# Patient Record
Sex: Male | Born: 1960 | Race: White | Hispanic: No | Marital: Married | Smoking: Never smoker
Health system: Southern US, Community
[De-identification: ages and names within clinical notes are randomized; demographics above are authoritative.]

## PROBLEM LIST (undated history)

## (undated) DIAGNOSIS — E785 Hyperlipidemia, unspecified: Secondary | ICD-10-CM

## (undated) HISTORY — PX: APPENDECTOMY (OPEN): SHX54

## (undated) HISTORY — PX: APPENDECTOMY: SHX54

## (undated) HISTORY — DX: Hyperlipidemia, unspecified: E78.5

## (undated) HISTORY — PX: KNEE ARTHROSCOPY: SHX127

---

## 2009-10-29 ENCOUNTER — Emergency Department (HOSPITAL_COMMUNITY): Admission: EM | Admit: 2009-10-29 | Discharge: 2009-10-29 | Payer: Self-pay | Admitting: Emergency Medicine

## 2010-12-19 ENCOUNTER — Ambulatory Visit (HOSPITAL_COMMUNITY)
Admission: RE | Admit: 2010-12-19 | Discharge: 2010-12-19 | Disposition: A | Discharge status: Home / Self Care | Payer: Self-pay | Source: Ambulatory Visit | Attending: Family Medicine | Admitting: Family Medicine

## 2010-12-19 ENCOUNTER — Other Ambulatory Visit (HOSPITAL_COMMUNITY): Payer: Self-pay | Admitting: Family Medicine

## 2010-12-19 DIAGNOSIS — R05 Cough: Secondary | ICD-10-CM | POA: Insufficient documentation

## 2010-12-19 DIAGNOSIS — R0602 Shortness of breath: Secondary | ICD-10-CM

## 2010-12-19 DIAGNOSIS — I959 Hypotension, unspecified: Secondary | ICD-10-CM | POA: Insufficient documentation

## 2010-12-19 DIAGNOSIS — R059 Cough, unspecified: Secondary | ICD-10-CM | POA: Insufficient documentation

## 2014-03-25 HISTORY — PX: ANKLE FRACTURE SURGERY: SHX122

## 2014-04-07 ENCOUNTER — Inpatient Hospital Stay: Payer: Self-pay | Admitting: Orthopaedic Surgery

## 2014-04-07 ENCOUNTER — Inpatient Hospital Stay
Admission: EM | Admit: 2014-04-07 | Discharge: 2014-04-10 | DRG: 494 | Disposition: A | Discharge status: Home / Self Care | Payer: Self-pay | Attending: Orthopaedic Surgery | Admitting: Orthopaedic Surgery

## 2014-04-07 ENCOUNTER — Emergency Department: Payer: Self-pay

## 2014-04-07 DIAGNOSIS — S82409A Unspecified fracture of shaft of unspecified fibula, initial encounter for closed fracture: Secondary | ICD-10-CM

## 2014-04-07 DIAGNOSIS — Z880 Allergy status to penicillin: Secondary | ICD-10-CM

## 2014-04-07 DIAGNOSIS — Y92838 Other recreation area as the place of occurrence of the external cause: Secondary | ICD-10-CM

## 2014-04-07 DIAGNOSIS — Y998 Other external cause status: Secondary | ICD-10-CM

## 2014-04-07 DIAGNOSIS — Y9389 Activity, other specified: Secondary | ICD-10-CM

## 2014-04-07 DIAGNOSIS — S82202A Unspecified fracture of shaft of left tibia, initial encounter for closed fracture: Secondary | ICD-10-CM

## 2014-04-07 DIAGNOSIS — M129 Arthropathy, unspecified: Secondary | ICD-10-CM | POA: Diagnosis present

## 2014-04-07 DIAGNOSIS — S82302A Unspecified fracture of lower end of left tibia, initial encounter for closed fracture: Secondary | ICD-10-CM | POA: Diagnosis present

## 2014-04-07 DIAGNOSIS — S82899A Other fracture of unspecified lower leg, initial encounter for closed fracture: Secondary | ICD-10-CM | POA: Diagnosis present

## 2014-04-07 DIAGNOSIS — H9190 Unspecified hearing loss, unspecified ear: Secondary | ICD-10-CM | POA: Diagnosis present

## 2014-04-07 DIAGNOSIS — Z9089 Acquired absence of other organs: Secondary | ICD-10-CM

## 2014-04-07 DIAGNOSIS — Y9239 Other specified sports and athletic area as the place of occurrence of the external cause: Secondary | ICD-10-CM

## 2014-04-07 DIAGNOSIS — S82402A Unspecified fracture of shaft of left fibula, initial encounter for closed fracture: Secondary | ICD-10-CM

## 2014-04-07 DIAGNOSIS — S82209A Unspecified fracture of shaft of unspecified tibia, initial encounter for closed fracture: Principal | ICD-10-CM | POA: Diagnosis present

## 2014-04-07 LAB — CBC AND DIFFERENTIAL
Basophils %: 0.5 % (ref 0.0–3.0)
Basophils Absolute: 0.1 10*3/uL (ref 0.0–0.3)
Eosinophils %: 0.6 % (ref 0.0–7.0)
Eosinophils Absolute: 0.1 10*3/uL (ref 0.0–0.8)
Hematocrit: 42.2 % (ref 39.0–52.5)
Hemoglobin: 14.7 gm/dL (ref 13.0–17.5)
Lymphocytes Absolute: 1.3 10*3/uL (ref 0.6–5.1)
Lymphocytes: 9.6 % — ABNORMAL LOW (ref 15.0–46.0)
MCH: 33 pg (ref 28–35)
MCHC: 35 gm/dL (ref 32–36)
MCV: 95 fL (ref 80–100)
MPV: 7.3 fL (ref 6.0–10.0)
Monocytes Absolute: 1 10*3/uL (ref 0.1–1.7)
Monocytes: 6.9 % (ref 3.0–15.0)
Neutrophils %: 82.4 % — ABNORMAL HIGH (ref 42.0–78.0)
Neutrophils Absolute: 11.4 10*3/uL — ABNORMAL HIGH (ref 1.7–8.6)
PLT CT: 273 10*3/uL (ref 130–440)
RBC: 4.45 10*6/uL (ref 4.00–5.70)
RDW: 12.7 % (ref 11.0–14.0)
WBC: 13.9 10*3/uL — ABNORMAL HIGH (ref 4.0–11.0)

## 2014-04-07 LAB — ECG 12-LEAD
P Wave Axis: 70 deg
P Wave Duration: 102 ms
P-R Interval: 146 ms
Patient Age: 52 years
Q-T Dispersion: 48 ms
Q-T Interval(Corrected): 383 ms
Q-T Interval: 366 ms
QRS Axis: 79 deg
QRS Duration: 84 ms
T Axis: 29 deg
Ventricular Rate: 66 /min

## 2014-04-07 LAB — BASIC METABOLIC PANEL
Anion Gap: 15.8 mMol/L (ref 7.0–18.0)
BUN / Creatinine Ratio: 21.1 Ratio (ref 10.0–30.0)
BUN: 20 mg/dL (ref 7–22)
CO2: 22.2 mMol/L (ref 20.0–30.0)
Calcium: 9.7 mg/dL (ref 8.5–10.5)
Chloride: 106 mMol/L (ref 98–110)
Creatinine: 0.95 mg/dL (ref 0.80–1.30)
EGFR: >60 mL/min/{1.73_m2}
Glucose: 126 mg/dL — ABNORMAL HIGH (ref 70–99)
Osmolality Calc: 284 mOsm/kg (ref 275–300)
Potassium: 4 mMol/L (ref 3.5–5.3)
Sodium: 140 mMol/L (ref 136–147)

## 2014-04-07 MED ORDER — VH HYDROMORPHONE HCL PF 1 MG/ML CARPUJECT
0.5000 mg | INTRAMUSCULAR | Status: DC | PRN
Start: 2014-04-07 — End: 2014-04-08
  Administered 2014-04-08 (×2): 0.5 mg via INTRAVENOUS
  Filled 2014-04-07 (×2): qty 1

## 2014-04-07 MED ORDER — ONDANSETRON 4 MG PO TBDP
ORAL_TABLET | ORAL | Status: AC
Start: 2014-04-07 — End: ?
  Filled 2014-04-07: qty 1

## 2014-04-07 MED ORDER — VH HYDROMORPHONE HCL 1 MG/ML (NARRATOR)
INTRAMUSCULAR | Status: AC
Start: 2014-04-07 — End: ?
  Filled 2014-04-07: qty 1

## 2014-04-07 MED ORDER — VH HYDROMORPHONE HCL PF 1 MG/ML CARPUJECT
1.0000 mg | Freq: Once | INTRAMUSCULAR | Status: AC
Start: 2014-04-07 — End: 2014-04-07
  Administered 2014-04-07: 1 mg via INTRAVENOUS

## 2014-04-07 MED ORDER — ACETAMINOPHEN 325 MG PO TABS
650.0000 mg | ORAL_TABLET | ORAL | Status: DC | PRN
Start: 2014-04-07 — End: 2014-04-08

## 2014-04-07 MED ORDER — HYDROCODONE-ACETAMINOPHEN 5-325 MG PO TABS
2.0000 | ORAL_TABLET | Freq: Once | ORAL | Status: AC
Start: 2014-04-07 — End: 2014-04-07
  Administered 2014-04-07: 2 via ORAL

## 2014-04-07 MED ORDER — ONDANSETRON 4 MG PO TBDP
4.0000 mg | ORAL_TABLET | Freq: Once | ORAL | Status: AC
Start: 2014-04-07 — End: 2014-04-07
  Administered 2014-04-07: 4 mg via ORAL

## 2014-04-07 MED ORDER — SENNOSIDES-DOCUSATE SODIUM 8.6-50 MG PO TABS
2.0000 | ORAL_TABLET | Freq: Two times a day (BID) | ORAL | Status: DC | PRN
Start: 2014-04-07 — End: 2014-04-10
  Administered 2014-04-09 – 2014-04-10 (×3): 2 via ORAL
  Filled 2014-04-07 (×3): qty 2

## 2014-04-07 MED ORDER — VITAMIN C 500 MG PO TABS
500.0000 mg | ORAL_TABLET | Freq: Every morning | ORAL | Status: DC
Start: 2014-04-08 — End: 2014-04-10
  Administered 2014-04-09 – 2014-04-10 (×2): 500 mg via ORAL
  Filled 2014-04-07 (×3): qty 1

## 2014-04-07 MED ORDER — FERROUS SULFATE 325 (65 FE) MG PO TABS
325.0000 mg | ORAL_TABLET | Freq: Every morning | ORAL | Status: DC
Start: 2014-04-08 — End: 2014-04-10
  Administered 2014-04-09 – 2014-04-10 (×2): 325 mg via ORAL
  Filled 2014-04-07 (×3): qty 1

## 2014-04-07 MED ORDER — DIPHENHYDRAMINE HCL 25 MG PO CAPS
25.0000 mg | ORAL_CAPSULE | Freq: Two times a day (BID) | ORAL | Status: DC | PRN
Start: 2014-04-07 — End: 2014-04-10
  Administered 2014-04-09: 25 mg via ORAL
  Filled 2014-04-07: qty 1

## 2014-04-07 MED ORDER — OXYCODONE HCL 5 MG PO TABS
15.0000 mg | ORAL_TABLET | ORAL | Status: DC | PRN
Start: 2014-04-07 — End: 2014-04-10
  Administered 2014-04-08 – 2014-04-10 (×13): 15 mg via ORAL
  Filled 2014-04-07 (×14): qty 3

## 2014-04-07 MED ORDER — HYDROCODONE-ACETAMINOPHEN 5-325 MG PO TABS
ORAL_TABLET | ORAL | Status: AC
Start: 2014-04-07 — End: ?
  Filled 2014-04-07: qty 2

## 2014-04-07 MED ORDER — ZOLPIDEM TARTRATE 5 MG PO TABS
5.0000 mg | ORAL_TABLET | Freq: Every evening | ORAL | Status: DC | PRN
Start: 2014-04-07 — End: 2014-04-10

## 2014-04-07 MED ORDER — LACTATED RINGERS IV SOLN
INTRAVENOUS | Status: DC
Start: 2014-04-07 — End: 2014-04-10

## 2014-04-07 MED ORDER — SODIUM CHLORIDE 0.9 % IV BOLUS
500.0000 mL | Freq: Once | INTRAVENOUS | Status: AC
Start: 2014-04-07 — End: 2014-04-07
  Administered 2014-04-07: 500 mL via INTRAVENOUS

## 2014-04-07 NOTE — ED Notes (Signed)
Dr. Call @ bedside splinting LLE.
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
## 2014-04-07 NOTE — ED Notes (Signed)
Dr. Marden Noble @ bedside.

## 2014-04-07 NOTE — ED Notes (Signed)
Dr. Marden Noble is coming to see pt.
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
## 2014-04-07 NOTE — ED Notes (Signed)
Xray bedside.

## 2014-04-07 NOTE — ED Notes (Signed)
Pt denied needing any pain medicine at this time.

## 2014-04-08 ENCOUNTER — Encounter
Admission: EM | Disposition: A | Discharge status: Home / Self Care | Payer: Self-pay | Source: Home / Self Care | Attending: Orthopaedic Surgery

## 2014-04-08 ENCOUNTER — Inpatient Hospital Stay: Payer: Self-pay

## 2014-04-08 ENCOUNTER — Inpatient Hospital Stay: Payer: Self-pay | Admitting: Certified Registered"

## 2014-04-08 ENCOUNTER — Inpatient Hospital Stay (HOSPITAL_COMMUNITY): Payer: Self-pay | Admitting: Certified Registered"

## 2014-04-08 DIAGNOSIS — S82209A Unspecified fracture of shaft of unspecified tibia, initial encounter for closed fracture: Secondary | ICD-10-CM

## 2014-04-08 HISTORY — PX: ORIF, TIBIA & FIBULA: SHX4919

## 2014-04-08 SURGERY — ORIF, TIBIA & FIBULA
Anesthesia: Anesthesia General | Site: Leg Lower | Wound class: Clean

## 2014-04-08 MED ORDER — GLYCOPYRROLATE 0.4 MG/2ML IJ SOLN
INTRAMUSCULAR | Status: AC
Start: 2014-04-08 — End: ?
  Filled 2014-04-08: qty 2

## 2014-04-08 MED ORDER — ACETAMINOPHEN 10 MG/ML IV SOLN
INTRAVENOUS | Status: AC
Start: 2014-04-08 — End: ?
  Filled 2014-04-08: qty 100

## 2014-04-08 MED ORDER — SODIUM CHLORIDE 0.9 % IV MBP
1.0000 g | Freq: Three times a day (TID) | INTRAVENOUS | Status: AC
Start: 2014-04-08 — End: 2014-04-09
  Administered 2014-04-08 – 2014-04-09 (×2): 1 g via INTRAVENOUS
  Filled 2014-04-08 (×2): qty 1000

## 2014-04-08 MED ORDER — DEXAMETHASONE SODIUM PHOSPHATE 4 MG/ML IJ SOLN
INTRAMUSCULAR | Status: DC | PRN
Start: 2014-04-08 — End: 2014-04-08
  Administered 2014-04-08: 4 mg via INTRAVENOUS

## 2014-04-08 MED ORDER — MORPHINE SULFATE 2 MG/ML IJ/IV SOLN (WRAP)
2.0000 mg | Status: DC | PRN
Start: 2014-04-08 — End: 2014-04-08

## 2014-04-08 MED ORDER — ENOXAPARIN SODIUM 40 MG/0.4ML SC SOLN
40.0000 mg | Freq: Every morning | SUBCUTANEOUS | Status: DC
Start: 2014-04-09 — End: 2014-04-10
  Administered 2014-04-09 – 2014-04-10 (×2): 40 mg via SUBCUTANEOUS
  Filled 2014-04-08 (×2): qty 0.4

## 2014-04-08 MED ORDER — MIDAZOLAM HCL 2 MG/2ML IJ SOLN
INTRAMUSCULAR | Status: DC | PRN
Start: 2014-04-08 — End: 2014-04-08
  Administered 2014-04-08: 2 mg via INTRAVENOUS

## 2014-04-08 MED ORDER — FENTANYL CITRATE 0.05 MG/ML IJ SOLN
INTRAMUSCULAR | Status: AC
Start: 2014-04-08 — End: ?
  Filled 2014-04-08: qty 5

## 2014-04-08 MED ORDER — ONDANSETRON HCL 4 MG/2ML IJ SOLN
INTRAMUSCULAR | Status: AC
Start: 2014-04-08 — End: ?
  Filled 2014-04-08: qty 2

## 2014-04-08 MED ORDER — ONDANSETRON HCL 4 MG/2ML IJ SOLN
INTRAMUSCULAR | Status: DC | PRN
Start: 2014-04-08 — End: 2014-04-08
  Administered 2014-04-08: 4 mg via INTRAVENOUS

## 2014-04-08 MED ORDER — MEPERIDINE HCL 50 MG/ML IJ SOLN
25.0000 mg | INTRAMUSCULAR | Status: DC | PRN
Start: 2014-04-08 — End: 2014-04-08

## 2014-04-08 MED ORDER — GLYCOPYRROLATE 0.2 MG/ML IJ SOLN
INTRAMUSCULAR | Status: DC | PRN
Start: 2014-04-08 — End: 2014-04-08
  Administered 2014-04-08: 0.2 mg via INTRAVENOUS

## 2014-04-08 MED ORDER — EPHEDRINE SULFATE 50 MG/ML IJ SOLN
INTRAMUSCULAR | Status: DC | PRN
Start: 2014-04-08 — End: 2014-04-08
  Administered 2014-04-08 (×3): 10 mg via INTRAVENOUS

## 2014-04-08 MED ORDER — ACETAMINOPHEN 500 MG PO TABS
1000.0000 mg | ORAL_TABLET | Freq: Four times a day (QID) | ORAL | Status: DC
Start: 2014-04-08 — End: 2014-04-10
  Administered 2014-04-08 – 2014-04-10 (×8): 1000 mg via ORAL
  Filled 2014-04-08 (×16): qty 2

## 2014-04-08 MED ORDER — MIDAZOLAM HCL 2 MG/2ML IJ SOLN
INTRAMUSCULAR | Status: AC
Start: 2014-04-08 — End: ?
  Filled 2014-04-08: qty 2

## 2014-04-08 MED ORDER — EPHEDRINE SULFATE 50 MG/ML IJ SOLN
INTRAMUSCULAR | Status: AC
Start: 2014-04-08 — End: ?
  Filled 2014-04-08: qty 1

## 2014-04-08 MED ORDER — CEFAZOLIN SODIUM-DEXTROSE 2-3 GM-% IV SOLR
2.0000 g | Freq: Once | INTRAVENOUS | Status: AC
Start: 2014-04-08 — End: 2014-04-08
  Administered 2014-04-08: 2 g via INTRAVENOUS
  Filled 2014-04-08: qty 50

## 2014-04-08 MED ORDER — PROPOFOL 10 MG/ML IV EMUL
INTRAVENOUS | Status: AC
Start: 2014-04-08 — End: ?
  Filled 2014-04-08: qty 20

## 2014-04-08 MED ORDER — PROPOFOL 10 MG/ML IV EMUL
INTRAVENOUS | Status: DC | PRN
Start: 2014-04-08 — End: 2014-04-08
  Administered 2014-04-08: 150 mg via INTRAVENOUS
  Administered 2014-04-08: 50 mg via INTRAVENOUS

## 2014-04-08 MED ORDER — HYDROMORPHONE HCL PF 2 MG/ML IJ SOLN
1.0000 mg | INTRAMUSCULAR | Status: DC | PRN
Start: 2014-04-08 — End: 2014-04-10
  Administered 2014-04-08 – 2014-04-09 (×2): 0.5 mg via INTRAVENOUS
  Administered 2014-04-09: 1 mg via INTRAVENOUS
  Administered 2014-04-09: 0.5 mg via INTRAVENOUS
  Administered 2014-04-09: 1 mg via INTRAVENOUS
  Administered 2014-04-10: 0.5 mg via INTRAVENOUS
  Filled 2014-04-08 (×7): qty 1

## 2014-04-08 MED ORDER — FENTANYL CITRATE 0.05 MG/ML IJ SOLN
INTRAMUSCULAR | Status: DC | PRN
Start: 2014-04-08 — End: 2014-04-08
  Administered 2014-04-08 (×4): 25 ug via INTRAVENOUS
  Administered 2014-04-08: 50 ug via INTRAVENOUS
  Administered 2014-04-08 (×2): 25 ug via INTRAVENOUS
  Administered 2014-04-08: 50 ug via INTRAVENOUS

## 2014-04-08 MED ORDER — ACETAMINOPHEN 10 MG/ML IV SOLN
INTRAVENOUS | Status: DC | PRN
Start: 2014-04-08 — End: 2014-04-08
  Administered 2014-04-08: 1000 mg via INTRAVENOUS

## 2014-04-08 MED ORDER — VALLEY PROMETHAZINE 50 MG/0.4 ML TOPICAL GEL UD (RPKG)
12.5000 mg | Freq: Two times a day (BID) | TOPICAL | Status: DC | PRN
Start: 2014-04-08 — End: 2014-04-08

## 2014-04-08 MED ORDER — DEXAMETHASONE SODIUM PHOSPHATE 4 MG/ML IJ SOLN
INTRAMUSCULAR | Status: AC
Start: 2014-04-08 — End: ?
  Filled 2014-04-08: qty 1

## 2014-04-08 MED ORDER — FENTANYL CITRATE 0.05 MG/ML IJ SOLN
25.0000 ug | INTRAMUSCULAR | Status: DC | PRN
Start: 2014-04-08 — End: 2014-04-08
  Administered 2014-04-08 (×2): 25 ug via INTRAVENOUS
  Filled 2014-04-08: qty 2

## 2014-04-08 MED ORDER — LIDOCAINE HCL (PF) 2 % IJ SOLN
INTRAMUSCULAR | Status: DC | PRN
Start: 2014-04-08 — End: 2014-04-08
  Administered 2014-04-08: 75 mg via INTRAVENOUS

## 2014-04-08 SURGICAL SUPPLY — 51 items
BANDAGE ACE STERILE 4 (Dressings) ×2 IMPLANT
BANDAGE ACE STERILE 6 LF (Dressings) IMPLANT
BANDAGE ESMARK 6 (Dressings) ×2 IMPLANT
BANDAGE SOF BAND KERLIX 4.5 IN (Dressings) ×2 IMPLANT
BANDAGE WEBRIL 4 STERILE (Dressings) ×2 IMPLANT
BASIN O.R. STERILE DISPOSABLE (Supply) ×2 IMPLANT
BIT DRILL QD 2.8MM (Supply) ×2 IMPLANT
BIT DRILL W/QC 2.5 MM LONG (Supply) ×2 IMPLANT
BIT DRILL W/QC 3.5 MM (Supply) ×2 IMPLANT
CANNISTER SUCTION 3000C (Supply) ×2 IMPLANT
CHLORAPREP W/ORANGE TINT 26ML (Supply) ×4 IMPLANT
COVER LIGHT HANDLE (ORTHO) (Supply) ×6 IMPLANT
CUFF TOURNIQUET 34 (Supply) ×2 IMPLANT
DRAPE C-ARM X-RAY (Supply) ×2 IMPLANT
DRAPE C-ARMOUR (Supply) ×2 IMPLANT
DRAPE U (Supply) ×2 IMPLANT
DRSG COMBINE 8 X 7.5 (Dressings) IMPLANT
DRSG XEROFORM 5 X 9 (Dressings) ×2 IMPLANT
GLOVE BIOGEL UND PI IND SZ 7.5 (Supply) ×2 IMPLANT
GLOVE SURGEON SYN PF SZ 7.5 (Supply) ×4 IMPLANT
GOWN IMPERV 2X W/TOWEL REUSE (Supply) ×2 IMPLANT
GOWN IMPERV LG W/TOWEL REUS (Supply) ×4 IMPLANT
IRRG INTERPULSE W/SX FAN SPRAY (Supply) IMPLANT
PACK EXTREMITY-LF (Supply) ×2 IMPLANT
PLATE 3.5 LCP ANT DIST 11H LT ×2 IMPLANT
PLATE TUBULAR 1/3 LCP 10 HOLE ×2 IMPLANT
SCREW CANCELL PART 4.0 X 35MM ×2 IMPLANT
SCREW CANCELL PART 4.0 X 40MM ×2 IMPLANT
SCREW CANCELL PART 4.0 X 45MM ×4 IMPLANT
SCREW CANCELLOUS 4.0 X 14 MM ×2 IMPLANT
SCREW CANCELLOUS 4.0 X 16 MM ×2 IMPLANT
SCREW CANCELLOUS 4.0 X 18 MM ×2 IMPLANT
SCREW CANCELLOUS 4.0 X 20 MM ×2 IMPLANT
SCREW CORTEX SELF-TAP 3.5X14 ×6 IMPLANT
SCREW CORTEX SELF-TAP 3.5X18 ×2 IMPLANT
SCREW CORTEX SELF-TAP 3.5X28 ×8 IMPLANT
SCREW CORTEX SELF-TAP 3.5X30 ×2 IMPLANT
SCREW CORTEX SELF-TAP 3.5X34 ×2 IMPLANT
SCREW LOCK SELF-TAP 3.5X34 ×2 IMPLANT
SCREW LOCK SELF-TAP 3.5X40 ×4 IMPLANT
SCRUB CHLOR/GLUCONATE 4% (Supply) ×2 IMPLANT
SOL SALINE IRRIG 3000ML (Supply) IMPLANT
SPLINT 4 X 15 ORTHOGLASS (Dressings) ×2 IMPLANT
SPLINT PLASTER 4 (Supply) ×2 IMPLANT
SPONGE GAUZE 4 X 4 12 PLY (Dressings) ×2 IMPLANT
STAPLER SKIN DISP35WIDE#PXW35 (Supply) ×4 IMPLANT
SUT VICRYL 1 J347H (Supply) ×2 IMPLANT
SUT VICRYL 2-0 J345H (Supply) ×2 IMPLANT
TOWEL (FAN-FOLD) 6/PK (Supply) ×4 IMPLANT
TRAY SKIN PREP  DRY (Supply) ×2 IMPLANT
WIRE GUIDE 1.6MM ×2 IMPLANT

## 2014-04-08 NOTE — Progress Notes (Signed)
Called Operator and transferred to Dr. Norval Gable cell, notified him of situation regarding missing Lovenox and post op Ancef orders. Telephone orders verified for the start of these medications.

## 2014-04-08 NOTE — Transfer of Care (Signed)
Anesthesia Transfer of Care Note    Patient: Ronald Austin    Last vitals:   Filed Vitals:    04/08/14 1306   BP: 146/87   Pulse: 97   Temp: 36.2 C (97.2 F)   Resp: 13   SpO2: 100%       Oxygen: Non-rebreather Mask     Mental Status:awake    Airway: Natural    Cardiovascular Status:  stable

## 2014-04-08 NOTE — Anesthesia Preprocedure Evaluation (Signed)
Anesthesia Evaluation    AIRWAY    Mallampati: II    TM distance: >3 FB  Neck ROM: full  Mouth Opening:full   CARDIOVASCULAR    cardiovascular exam normal, regular and normal       DENTAL    no notable dental hx     PULMONARY    pulmonary exam normal and clear to auscultation     OTHER FINDINGS                      Anesthesia Plan    ASA 1     general                     Detailed anesthesia plan: general LMA            informed consent obtained    Plan discussed with CRNA.      pertinent labs reviewed

## 2014-04-08 NOTE — Progress Notes (Signed)
Paged Dr. Marden Noble and Jarold Motto due to pt not having post op Ancef orders and Lovenox orders. Passed this on in report to oncoming nurse and reported this to charge nurse.

## 2014-04-08 NOTE — PT Progress Note (Signed)
[facility]  [facility]  [address]  [place] [address]  Department of Rehabilitation Services  [phone]  RUSSEL MORAIN CSN: [id number]  ORTHOPAEDICS 415/415-A    Physical Therapy General Note  Time: 8:11    Received orders for PT evaluation, per chart review patient is pending surgical fixation of left comminuted distal tibia and fibular fracture. Per notes surgery is scheduled for tomorrow, PT will continue to follow.     Plan: Will follow up post-operatively for PT evaluation as appropriate.     Genevive Bi, PT, DPT

## 2014-04-08 NOTE — Op Note (Signed)
PREOPERATIVE DIAGNOSES:  1.  Closed comminuted left distal tibial shaft fracture      with intra-articular extension.  2.  Closed comminuted distal fibular fracture.    POSTOPERATIVE DIAGNOSES:  1.  Closed comminuted left distal tibial shaft fracture      with intra-articular extension.  2.  Closed comminuted distal fibular fracture.    PROCEDURES:  1.  Open reduction and internal fixation of the distal      tibia using an anterolateral locking plate by [employer], 11      hole and two independent and AP screws for compression of the      intra-articular tibial fracture.  2.  Open reduction and internal fixation of the distal      fibula, posterior lateral approach with suture cerclage of      central comminution.  10 hole 1/3 tubular plate.    ANESTHESIA: General.    SURGEON: Acey Lav, DO    ASSISTANTS: Ronald Austin, RNFA.    BLOOD LOSS: 30 mL.    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  Unfortunately, he fractured the tibia when  performing his job and he is brought from [place], [place], to the [facility] for treatment.  An  isolated injury was noted, with his neurovascular status intact.   He had unstable, comminuted distal tibia shaft fracture with suspicion of  intra-articular component extending as best seen on the lateral  image.  The fibula was highly comminuted.  Also neurovascularly  intact.  The nature of the problem was outlined with him.  He  understands that fixation would be necessary to gain functional  use of the leg.  Risks with that including but not limited to  infection, malunion, nonunion, blood clot, pulmonary embolism,  hardware complications, nerve or vessel injury, incomplete  resolution of pain or recovery of full function.  Consent was  obtained to go forward.    DESCRIPTION OF PROCEDURE: This patient was brought to the  operative suite.  After having satisfied laboratory and clinical  criteria, he is placed supine on the  operative table and  administered a general anesthetic.  The operative Jackson flat  table and administered a general anesthetic.  The ER splint was  removed.  Soft tissues were modestly inflamed and it was felt  that surgical intervention could be done safely.  He does note  ecchymoses, minimal soft tissue swelling.  No blisters and no  distended skin. The area anteriorly where his tibia was prominent had a small dimple. A tourniquet was placed at upper aspect of the  appropriate left lower extremity.  His toes were suspended and  thorough prepping and draping were accomplished.  A full  time-out procedure had been undertaken including appropriate use  of antibiotics and an SCD on the opposite extremity.  C-arm was  available.  Instrumentation was available for treatment of the  fibula and tibia fractures.  The limb was exsanguinated and the  tourniquet was inflated.    Fibula: Our first approach was posterior lateral to the fibula.  The careful dissection was carried down to the soft tissues and  the periosteum was exposed and therefore, elevating it  adequately to expose a very comminuted fracture.  A 10 hole  plate was selected.  Distal fixation with a cancellous screw was  obtained.  Once we had distal fixation, the fracture was  manipulated with the use of one clamp and additionally  manipulated with  the 2nd clamp.  With this, we were able to  obtain appropriate fibular length and alignment.  Still  significant comminution was noted through the mid zone of the  fracture.  Three screws proximally, three screws distally were  placed to obtain a stable construct.  Cerclage suture fixation  of the comminution was accomplished using 0 Vicryl.  The  irrigation was accomplished.  Images were saved.  At a later  point, I did change out the second screw from the end from an 18  to 16 mm.  Closure was accomplished first periosteal and fascia  closure, 2-0 Vicryl subcuticular closure was then done with 2-0  Vicryl.   Staples were placed at a later time following closure  of the tibial incision.    Tibia: An anterior-lateral approach to the distal tibia was then undertaken.  An incision was made.  Very careful dissection was carried down  to the soft tissues and exposing then the neurovascular bundle  and appropriately the anterior tibia.  Adequate subperiosteal  elevation was accomplished to allow exposure of the comminuted  tibial fracture.  X-rays did identify in the lateral view, a  coronal fracture extending intra-articularly, this was  nondisplaced.  Our first effort then was to perform lag fixation  of the shaft fractures.  The distal fragment had a coronal split,  which required an oblique lag screw for good fixation.  This was  done after obtaining and maintaining the fracture alignment with  the bone clamp.  A 2nd lag screw was placed essentially counter  oblique to this, lagging the distal and proximal shaft fractures.  Good overall alignment was noted with these.  An 11 hole  anterolateral locking plate was selected, as we had planned lag screws  to avoid compromise with the plate.  The plate was perfectly  Positioned, and was slightly bent and contoured.  A pin was placed  appropriately to maintain overall general plate alignment. An AP  compression screw was done with a cancellous AP screw.  Two  independent AP screws were then placed, also using partially  threaded cancellous screws.  Good compression and reduction of  the intra-articular fracture was noted on the lateral image.  Additional screws were then placed within the plate.  Three  proximal bicortical plate purchase screws and three additional  locking screws made in the anterior posterior direction at the  distal plate.  Another metaphyseal screw was placed using a  cortical design.  A stable construct was gained.  The soft  tissues were kept moist during the procedure and thorough  irrigation was once again accomplished prior to closure.  The  retinacular  tissues were oversewn with 0 Vicryl.  The tension on  the skin was moderate but 0 Vicryl was selected for initial  fixation of skin, multiple 2-0 Vicryls were then applied  approximately every centimeter throughout the length of this  incision (approximately 18 cm).  The staples were then applied  in the tibial incision as well as the fibular incision.  Bulky  dressings were applied.  It should be noted the tourniquet had  been deflated prior to closure of the tibial wound and a couple of  small bleeders had been coagulated.  Following bulky dressing  application a very well-padded coaptation splint was applied  using a prefab fiberglass.  This was held with an Ace wrap.  One  small slab of fiberglass was placed posteriorly as well.    His anesthetic had been tolerated well.  He was taken then to  recovery area for additional observation.  Sponge and needle  counts were correct.        [id number]  DD: 04/08/2014 13:18:55  DT: 04/08/2014 14:38:10  JOB: [id number]

## 2014-04-08 NOTE — Progress Notes (Signed)
Patient arrived to unit at 2340. Assessment was done. Patient was given a boxed lunch.  NPO as of 0020 for surgery 8/15.  IV pain medication was used to control pt's pain.  Neuro-vascular assessment intact.  Splint is intact.  Pt voiding in urinal.  Call bell and belongings are within reach.  VSS  End of shift.

## 2014-04-08 NOTE — OR Nursing (Signed)
During Time Out Pt determined Fire Risk 1 per hospital protocol.Sterile NaCl and Water to field, used PRN.Pt to OR; identified per hospital protocol. Assisted to OR bed, safety strap across mid thigh and secure; warm blankets applied, patient attended at all times. SCDs on and functioning prior to induction.
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
## 2014-04-08 NOTE — Plan of Care (Signed)
Problem: Pain  Goal: Patient's pain/discomfort is manageable  Outcome: Progressing  Reviewed pain management plan with patient. Encouraged patient to monitor own pain level. Will continue to monitor.

## 2014-04-08 NOTE — Plan of Care (Signed)
Pt has remained free from falls and injury since hospitalization.  Pain is managed with PO and IV medication.

## 2014-04-08 NOTE — Brief Op Note (Signed)
BRIEF OP NOTE    Date Time: 04/08/2014 1:06 PM    Patient Name:   Ronald Austin    Date of Operation:   04/08/2014    Providers Performing:   Surgeon(s):  Acey Lav, DO    Assistant (s):   Circulator: Karna Dupes, RN  Radiology Technologist: Chipper Oman  Scrub Person: Connye Burkitt  First Assistant: Kenna Gilbert, RN    Operative Procedure:   Procedure(s):  ORIF, TIBIA distal comminuted shaft with intra-articular extension &  ORIF,  FIBULA comminuted    Preoperative Diagnosis:   Pre-Op Diagnosis Codes:     * Fractured tibia distal comminuted shaft with intra-articular extension and        Comminuted  fibula, left, closed, initial encounter [823.82]    Postoperative Diagnosis:   Post-Op Diagnosis Codes:     * Fractured tibia distal comminuted shaft with intra-articular extension and        comminuted fibula, left, closed, initial encounter [823.82]    Anesthesia:   General    Estimated Blood Loss:    * No values recorded between 04/08/2014 10:09 AM and 04/08/2014 12:59 PM * 30    Implants:     Implant Name Type Inv. Item Serial No. Manufacturer Lot No. LRB No. Used Action   PLATE TUBULAR 1/3 LCP 10 HOLE - [id number]  PLATE TUBULAR 1/3 LCP 10 HOLE  [employer]YNTHES  Left 1 Implanted   SCREW CANCELLOUS 4.0 X 16 MM - [id number]  SCREW CANCELLOUS 4.0 X 16 MM  [employer]ES  Left 1 Implanted   SCREW CORTEX SELF-TAP 3.5X14 - [id number]  SCREW CORTEX SELF-TAP 3.5X14  VHSYN[employer]ES  Left 2 Implanted   SCREW CANCELLOUS 4.0 X 14 MM - [id number]  SCREW CANCELLOUS 4.0 X 14 MM  VHSYN[employer]ES  Left 1 Implanted   SCREW CORTEX SELF-TAP 3.5X18 - [id number]  SCREW CORTEX SELF-TAP 3.5X18  VHSYN[employer]ES  Left 1 Implanted   PLATE 3.5 LCP ANT DIST 11H LT - [id number]  PLATE 3.5 LCP ANT DIST 11H LT  VHSYN[employer]ES  Left 1 Implanted   SCREW CORTEX SELF-TAP 3.5X30 - [id number]  SCREW CORTEX SELF-TAP 3.5X30  V[employer]YN[employer]ES  Left 1 Implanted   SCREW CORTEX SELF-TAP 3.5X28 - [id number]  SCREW CORTEX SELF-TAP 3.5X28  VHSYN[employer]ES  Left 4 Implanted   SCREW CANCELL  PART 4.0 X - [id number]  SCREW CANCELL PART 4.0 X  VHSYN[employer]ES  Left 1 Implanted   SCREW CANCELL PART 4.0 X - [id number]  SCREW CANCELL PART 4.0 X  VHSYNTHES  Left 1 Implanted   SCREW CORTEX SELF-TAP 3.5X34 - [id number]  SCREW CORTEX SELF-TAP 3.5X34  VHSYN[employer]ES  Left 1 Implanted   SCREW LOCK SELF-TAP 3.5X40 - [id number]  SCREW LOCK SELF-TAP 3.5X40  V[employer]YN[employer]ES  Left 2 Implanted   SCREW LOCK SELF-TAP 3.5X34 - [id number]  SCREW LOCK SELF-TAP 3.5X34  VHSYNTHES  Left 1 Implanted   SCREW CANCELLOUS 4.0 X 18 MM - [id number]   SCREW CANCELLOUS 4.0 X 18 MM   VHSYNTHES   Left 1 Implanted       Drains:   Drains: no    Specimens:    no      SPECIMENS (last 24 hours)      Pathology Specimens      04/08/14 1022             Specimen Information    Specimen Testing Required None per surgeon  Findings:   Good bone quality     Complications:   no      Signed by: Acey Lav, DO                                                                           [facility]

## 2014-04-08 NOTE — Anesthesia Postprocedure Evaluation (Signed)
Anesthesia Post Evaluation    Patient: Ronald Austin    Procedures performed: Procedure(s) with comments:  ORIF, TIBIA & FIBULA - orif left distal tibia distal fibula ante lateral plates wants  marco and 1st assist    Anesthesia type: General LMA    Patient location:Phase I PACU    Last vitals:   Filed Vitals:    04/08/14 1400   BP: 133/69   Pulse: 60   Temp:    Resp: 12   SpO2: 96%       Post pain: Continue adjustment of pain medication; patient falling asleep with pain medication; transfer to floor and begin post-op pain meds.     Mental Status:awake    Respiratory Function: tolerating nasal cannula    Cardiovascular: stable    Nausea/Vomiting: patient not complaining of nausea or vomiting    Hydration Status: adequate    Post assessment: no apparent anesthetic complications

## 2014-04-09 MED ORDER — MAGNESIUM HYDROXIDE 400 MG/5ML PO SUSP
30.0000 mL | Freq: Every day | ORAL | Status: DC
Start: 2014-04-10 — End: 2014-04-10
  Filled 2014-04-09: qty 30

## 2014-04-09 MED ORDER — DOCUSATE SODIUM 100 MG PO CAPS
100.0000 mg | ORAL_CAPSULE | Freq: Every day | ORAL | Status: DC
Start: 2014-04-09 — End: 2014-04-10
  Administered 2014-04-09 – 2014-04-10 (×2): 100 mg via ORAL
  Filled 2014-04-09 (×2): qty 1

## 2014-04-09 NOTE — Plan of Care (Signed)
Problem: Safety  Goal: Patient will be free from injury during hospitalization  Outcome: Progressing    Problem: Pain  Goal: Patient's pain/discomfort is manageable  Outcome: Progressing    Problem: Psychosocial and Spiritual Needs  Goal: Demonstrates ability to cope with hospitalization/illness  Outcome: Progressing    Problem: Moderate/High Fall Risk Score >/=15  Goal: Patient will remain free of falls  Outcome: Progressing  Up with minimal assist.

## 2014-04-09 NOTE — Plan of Care (Signed)
Problem: Pain  Goal: Patient's pain/discomfort is manageable  Outcome: Progressing

## 2014-04-09 NOTE — Progress Notes (Signed)
Assumed care of patient at 2300. Patient laying in bed with leg elevated. Pain controlled with oral pain meds. Continue to monitor pain level. No changes to assessment. Call bell within reach. Family at bedside. Monitor patient with rounds.

## 2014-04-09 NOTE — Plan of Care (Signed)
Problem: Psychosocial and Spiritual Needs  Goal: Demonstrates ability to cope with hospitalization/illness  Outcome: Progressing
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
## 2014-04-09 NOTE — Progress Notes (Signed)
Ortho, POD # 1  PAinful, requiring iv narcotics  No nausea  No other complaints  VSS afeb  Toes warm and sensate  Leg elevated  Xrays shared and explained  To try PT and plan for home tomorrow. Add bowel program.

## 2014-04-09 NOTE — Plan of Care (Signed)
Problem: Pain  Goal: Patient's pain/discomfort is manageable  Outcome: Progressing  Oral pain meds given. Continue to monitor pain level

## 2014-04-09 NOTE — Progress Notes (Signed)
Patient resting in bed with left leg elevated and ice bags in place.Assisted to reposition as needed.  Medicated with Dilaudid for breakthrough pain. Stool softener given per patient request. Bowels sounds hypoactive.

## 2014-04-09 NOTE — Progress Notes (Signed)
Assumed care of patient at 2300. Patient laying in bed c/o itching and pain. Benadryl and oral pain meds given. Continue to monitor pain level. No changes to assessment. Call bell within reach. Monitor patient with rounds.

## 2014-04-10 ENCOUNTER — Encounter: Payer: Self-pay | Admitting: Orthopaedic Surgery

## 2014-04-10 MED ORDER — MAGNESIUM HYDROXIDE 400 MG/5ML PO SUSP
30.0000 mL | Freq: Every day | ORAL | Status: DC | PRN
Start: 2014-04-10 — End: 2014-04-10
  Administered 2014-04-10: 30 mL via ORAL
  Filled 2014-04-10: qty 30

## 2014-04-10 MED ORDER — OXYCODONE HCL 15 MG PO TABS
15.0000 mg | ORAL_TABLET | ORAL | Status: DC | PRN
Start: 2014-04-10 — End: 2017-12-10

## 2014-04-10 MED ORDER — OXYCODONE HCL ER 10 MG PO T12A
10.0000 mg | EXTENDED_RELEASE_TABLET | Freq: Two times a day (BID) | ORAL | Status: DC
Start: 2014-04-10 — End: 2014-04-10
  Administered 2014-04-10: 10 mg via ORAL
  Filled 2014-04-10: qty 1

## 2014-04-10 MED ORDER — ACETAMINOPHEN 500 MG PO TABS
1000.0000 mg | ORAL_TABLET | Freq: Four times a day (QID) | ORAL | Status: DC
Start: 2014-04-10 — End: 2014-04-10

## 2014-04-10 MED ORDER — OXYCODONE HCL ER 10 MG PO T12A
10.0000 mg | EXTENDED_RELEASE_TABLET | Freq: Two times a day (BID) | ORAL | Status: DC
Start: 2014-04-10 — End: 2017-12-10

## 2014-04-10 MED ORDER — ACETAMINOPHEN 500 MG PO TABS
1000.0000 mg | ORAL_TABLET | Freq: Four times a day (QID) | ORAL | Status: AC
Start: 2014-04-10 — End: ?

## 2014-04-10 MED ORDER — ASPIRIN 325 MG PO TABS
325.0000 mg | ORAL_TABLET | Freq: Two times a day (BID) | ORAL | Status: AC
Start: 2014-04-10 — End: ?
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
## 2014-04-10 NOTE — Progress Notes (Signed)
He hurts a lot. Some numbness of the toes  No nausea. No BM  VSS afebrile  Non elevated position is very painful  Re try discharge this PM, pain coverage and a BM are desired.

## 2014-04-10 NOTE — PT Progress Note (Signed)
[facility]  [facility]  [address]  [place] [address]  Department of Rehabilitation Services  [phone]  Ronald Austin    CSN: [id number]    ORTHOPAEDICS   415/415-A    Physical Therapy Daily Treatment Note    Time of treatment: Time Calculation  PT Received On: 04/10/14  Start Time: 1104  Stop Time: 1119  Time Calculation (min): 15 min    PT Visit Number: 2    Last seen by Physical therapist on: 04/10/14    Medical Diagnosis/Pertinent medical/surgical details: S/P ORIF L TIB-FIB FX.    Precautions and Contraindications:  Precautions  Weight Bearing Status: LLE non weight bearing  Other Precautions: Falls risk    Assessment:   Progress: Discontinue PT (HIGH LEVEL FUNCTION. )   Patient continues to have the following impairments:Decreased endurance/activity tolerance;Gait impairment.        Goals:   Goals  Goal Formulation: With patient  STG(1-2 sessions):  1. Patient will perform bed mobility independently. MET  2. Patient will perform sit-stand modified independently c FWW/crutches. MET  LTG(by discharge):  3. Patient will ambulate 100 feet or more c FWW or crutches maintaining NWB LLE. MET(LESS DISTANCE DUE TO PAIN)  4. Patient will manage one step c FWW or crutches to safely enter home. DISCONTINUE     Risks/Benefits/POC Discussed with Pt/Family: With patient    Plan:      D/C P.T.        DISCHARGE RECOMMENDATIONS     DME Recommended for Discharge: Other (Comment);Front wheel walker    Discharge Recommendation: Home with supervision    Subjective:   "IT HURTS A LOT. THE MORE I MOVED AROUND YESTERDAY, THE WORSE THE PAIN GOT. THE DOCTOR WANTS ME TO KEEP IT UP SO I DON'T GET COMPLICATIONS WITH THE SKIN."   Patient is agreeable to participation in the therapy session.     Objective:   Observation of Patient/Vital Signs:    Patient is in bed with SCD's in place. ICE APPLIED L LEG. BED IN KEMP POSITION.    Patient's medical condition is appropriate for Physical therapy  intervention at this time.    Vital Signs   BP Supine  mmHg   BP Sitting  mmHg   BP Standing  mmHg   BP with activity  mmHg   HR Supine  bpm   HR Sitting  bpm   HR standing  bpm   HR with activity  bpm   SpO2 at rest     SpO2 with activity       Edema: L LEG  Skin Inspection: INCISIONS  Sensation: NT    Cognition  Cognition  Arousal/Alertness: Appropriate responses to stimuli  Attention Span: Appears intact  Orientation Level: Oriented X4  Memory: Appears intact  Following Commands: Follows all commands and directions without difficulty  Safety Awareness: independent  Insights: Fully aware of deficits  Problem Solving: Able to problem solve independently    Functional Mobility:   Functional Mobility  Supine to Sit: Independent (MOVED S DIFFICULTY OOB.)  Scooting to EOB: Independent  Sit to Supine: Independent (GOOD LLE MOVEMENT.)  Sit to Stand: Modified Independent (MAINTAINED NWB S DIFFICULTY. NO LOB C FWW.)  Stand to Sit: Independent (GOOD CONTROL C UE ASSIST.)                    Locomotion  Ambulation: with front-wheeled walker;Modified Independent  Ambulation Distance (Feet): 20 Feet (LIMITED BY THERAPIST DUE TO  PAIN.)  Pattern: decreased step length;decreased cadence (HOP-TO PATTERN. EXCELLENT CONTROL C NO LOB.)    Therapeutic Exercise:       Manual Therapy:       Neuro-Re-education:  Neuro Re-Ed  Sitting Balance: sitting reaching activities;independent;without support  Standing Balance: with support;modified independent                              Treatment Activities:    "X" All That Apply    Therapeutic Exercise    Functional Activity    Gait Training X   Stair Training    Neuromuscular Re-education    Balance Training    CPM Adjustment    Self Care Train    DME Adjustments    Brace Fitting/Refit-train    Pt/Family Education X   Wound Care    Manual Therapy    Other (please specify)         Educated the patient to role of physical therapy, plan of care, goals of therapy and safety with mobility and  ADLs.    Individuals Educated :  (X in all that apply)           Patient X           Spouse/Significant Other            Family            Caregiver            Nursing Staff            Other (specify)    Strategy:            Explanation X           Demonstration            Handout            Video            Other (specify)    Response:             Verbalized Understanding X           Demonstrated Understanding X           Questionable Understanding            No Understanding            Refused Education        Pt positioned at conclusion of session: (please place "x" in all that apply)   Supine X ON SOFA   Sitting    Family/visitors present     Needs in reach  X   Bed/chair alarm set    No distress    Distress, RN notified X   Affected Extremity Elevated    CPM    SCDs/foot pumps applied    Ice applied    Other  CNA IN TO MAKE BED AND PT TO RETURN TO BED IN KEMP POSITION.       PT Treatment Team Communication: (please place "x" in all that apply)   Spoke with RN/CNA X   Spoke with RNCM/SW    Spoke with MD/NP/PA    Re: Patient position X   Re: D/C needs    Re: Patient participation with Therapy X   Re: Vital Signs    Re: Further Recommendations     Re: CPM settings    White board updated     PT/PTA communication: via written note and verbal communication  as needed.    RN notified of session outcome.    Recommend client BE OOB SHORT DURATIONS. MAINTAIN LLE ELEVATION.      Maryann Alar

## 2014-04-10 NOTE — Progress Notes (Signed)
Pt is being discharged to home  Education complete.  Pt is packed and ready for transport, vital signs are stable, dressings dry and intact  Transport arrived and pt has left the floor.  End of care.    Mckell Riecke C [place], 7:00 PM

## 2014-04-10 NOTE — Progress Notes (Signed)
He is feeling that he can head home. Frriends are comin g to get him.  Pain better with the OxyContin  Now heading in for a BM  Afebrile  Gait safe    Home today  Continue elevation and icing  Keep the splint on and dry.  Take aspirin 325 mg twice daily for DVT guard  Add tylenol up to 4000 mg daily for pain relief.  Scripts for OxyContin 10 mg, 20, one every 12 hours if needed    Roxicodone 15 mg , 50, one every 4 hours if needed  Come to my office in about 12 days for a check up  Call for increased pain or fever, or concern.
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
## 2014-04-10 NOTE — Discharge Instructions (Signed)
Follow all instructions given by Physical therapy. You are non weight bearing to your affected leg. Always use your walker.   Use ice packs as needed. Elevate your leg on pillows as much as possible.   Maintain dressing dry and intact. Do not get it wet.   Increase fruits, fiber, and fluids to prevent constipation.   Take ordered (if ordered) anticoagulant as directed.       Fractured Ankle  Ankle or Lower Leg (Tibia/Fibula) Fractures  Discharge Instructions for Fractured Ankle OR Lower Leg (Tibia/Fibula)  NO SMOKING!  Activity/ Exercise:  You may require assistance with all activities for several weeks. Your doctor has ordered that the weight bearing on your leg be restricted. It is important to follow this weight bearing restriction so that your ankle or lower leg bones can heal. Use your walker or crutches when up walking to protect your leg and allow for healing. Elevate your leg when sitting down. Wiggle your toes and pump your ankles frequently to help circulation.  If you are using crutches:   Crutches require good balance and upper body strength.  . Remember to stand up straight  . Wear good supportive shoe(s)  . Do not rest your armpits on the arm rest of the crutch. This may press on nerves and cause numbness                       Climbing Stairs:  You may go up and down stairs with assistance if you are stable walking. Always remember when going up, the good leg goes up first.  When going down; the surgical leg goes down first.   Bathing/ Toileting:  You do not want to get your cast or dressing wet.  Most will need to sponge bathe. A shower is not recommended as water may drip down into the cast or dressing.  Do not sit in a tub.  If bathing at the sink, you may want to sit to avoid losing your balance.   Sleeping/Resting:  Take frequent rest periods during the day. Use a pillow between your legs when you lie on your side to support your leg. Elevate your leg when sitting or laying down. You may apply ice  packs to your leg to help with swelling. Do not let your leg hang down for long periods as this may cause an increase in swelling to your leg &/or foot.   Diet:  Your diet should be high in protein and carbohydrates (unless you are on a special diet, such as a diabetic or renal diet). Eat well balanced meals so that your body has proper nutrition to heal and restore strength. Examples of foods high in protein are lean meats, poultry, fish, eggs, and milk. Examples of foods high in carbohydrates are breads, cereal, pasta , beans, potatoes, and vegetables. Constipation can be a side effect of narcotics taken for pain relief. Drinking lots of water and eating high fiber foods can prevent this from occurring. Examples of these foods are bran, cereals, wheat bread, prunes, apples, and green leafy vegetables. Taking stool softeners and laxatives may also be needed. Examples are Metamucil, Senokot, or Milk of Magnesia.   Cast Care or Dressing Care:  Marland Kitchen Do not stick anything down cast or dressing. If you had surgery, there is an incision under your cast/dressing.   . Do not allow water to drip down into the cast/dressing.  . Do not shake powder into the cast/dressing.  . Do  not remove the cast or dressing.  Keep cast/dressing dry and clean. Keep broken leg elevated when not walking. Inspect your toes on the broken leg for changes in temperature, color or the ability to move. Wiggle your toes frequently. If you were ordered a cast boot, be sure to wear it when up walking to prevent the cast from getting dirty, wet or cracked. If your cast was split in the hospital and becomes loose after several days, put tape around the cast. Do not remove or cut cast/dressing.  Medications:  The doctor has written a prescription for your pain medication and any new medications that you should take. See the complete list on your discharge medication sheet. Continue taking all your previous medications unless otherwise indicated. The nurse  will instruct you on any changes. If refills are needed, please call the doctors office Monday -Friday. Do not call during evening hours or on the weekends.   Riding in the Car:  When getting into a car, back up to the seat until you feel the seat behind your legs. Reach back until you feel the seat, then sit down and turn your body as someone helps lift your leg into the car. When getting out, someone may need to help lift your leg out of the car. Slide to the edge of the seat and then stand.  Contact the Doctor for the Following:  . Increased severe pain not relieved by pain medication  . Increased temperature that continues to be elevated over 101 degrees  . Excessive swelling in your legs or calf tenderness  . Chest pain or shortness of breath  . Cast becomes too loose or tight  . Foul odor from cast or dressing  . Toes on broken leg become swollen, discolored or numb   . Excessive drainage from cast or dressing  Safety Hints:  . Remove scatter rugs and electrical cords to prevent falling  . Use non-skid mats and handrails in your shower and bath.  . Be careful around pets. Their jumping may cause you to lose your balance.  Other Information:   Your doctor must approve when you may return to work.  These instructions are a general guide for patients with a fractured ankle or lower leg. You may have other special needs that are not addressed here. Please discuss them with your doctor.  TELEPHONE NUMBERS:   Hospital Main Line...............................................................................[phone]      Orthopaedic Unit.................................................................................[phone]     Physical Therapy Dept.........................................................................[phone]     [facility] [employer]...........................[phone]  [address] [place], [place] [address]    (Drs. Carlena Sax,  T. Greer Pickerel, &  Zoller)     [facility]..........................[phone]  [address]  [place], [place] [address]  [facility]  Located behind [facility] at [facility] Open [facility]  (Drs. Carlena Sax,  T. Greer Pickerel, & Zoller)     [facility] [place], [place]) [phone]  [address]  [place], [place] [address]  (Drs. Baechlor, Osborne Casco, Ronnette Juniper & [facility] )      [facility].............................................[phone]  [address]  [place], [place] [address]  [facility]  (Dr Montel Culver)      A copy of this educational material has been provided for the patient and/or caregiver to take home       Edited 5/15  Fracture,Ankle, Distal Fibula  You have a fracture (broken bone) of the end of the fibula bone. This is one of two bones that support the ankle joint.    Home Care:   You will be given a splint, cast or special boot to prevent movement at the site of injury. Do not put weight on a splint; it will break. Follow your doctor's advice regarding when to begin bearing weight on a cast or boot.   Keep your leg elevated when sitting or lying down. When sleeping, place a pillow under the injured leg. When sitting, support the injured leg so it is level with your waist. This is very important during the first 48 hours.   Keep the cast/splint completely dry at all times. When bathing, protect the cast/splint with a large plastic bag, rubber-banded at the top end. If a fiberglass cast or splint gets wet, you can dry it with a hair-dryer.   Place an ice pack (ice cubes in a plastic bag, wrapped in a towel) on the splint/cast over the injured area for 20 minutes every 2 hours during the first day.You can place the ice pack directly over the splint/cast. Continue this 3-4 times a  day for the next two days.   You may use acetaminophen (Tylenol) or ibuprofen (Motrin, Advil) to control pain, unless another pain medicine was prescribed. [NOTE: If you have chronic liver or kidney disease or ever had a stomach ulcer or GI bleeding, talk with your doctor before using these medicines.]  Follow Up  with your doctor in one week, or as advised by our staff, to be sure the bone is healing properly. If you were given a splint, it may be changed to a cast after the swelling goes down.  [NOTE: A radiologist will review any X-rays that were taken. We will notify you of any new findings that may affect your care.]  Get Prompt Medical Attention  if any of the following occur:   The plaster cast or splint becomes wet or soft   The fiberglass cast or splint remains wet for more than 24 hours   Increased tightness or pain under the cast or splint   Toes become swollen, cold, blue, numb or tingly   2000-2014 [employer] [address], [place], [place] [address]. All rights reserved. This information is not intended as a substitute for professional medical care. Always follow your healthcare professional's instructions.          Fracture: Lower Extremity    You have a break (fracture) of the leg. A fracture is treated with a splint or cast or special boot. It will take at about 4-6 weeks for the fracture to heal. Surgery may be needed to fix severe injuries.  Home Care:   You will be given a splint, cast, boot, or other device to prevent movement at the site of injury. Unless you were told otherwise, use crutches or a walker and do not bear weight on the injured leg until cleared by your doctor to do so. (Crutches and walkers can be rented at [employer] and surgical/orthopedic supply stores).   Keep your leg elevated to reduce pain and swelling. When sleeping, place a pillow under the injured leg. When sitting, support the injured leg so it is level with your waist. This is very  important during the first 48 hours.   Apply an ice pack (ice cubes in a plastic bag, wrapped in a towel) over the injured  area for 20 minutes every 1-2 hours the first day. You can place the ice pack directly over the splint/cast. Continue with ice packs 3-4 times a day for the next two days, then as needed for the relief of pain and swelling.   Keep the cast/splint/boot completely dry at all times. Bathe with your cast/splint/boot out of the water, protected with a large plastic bag, rubber-banded at the top end. If a boot or fiberglass cast/splint gets wet, you can dry it with a hair-dryer.   You may use acetaminophen (Tylenol) or ibuprofen (Motrin, Advil) to control pain, unless another pain medicine was prescribed. [NOTE: If you have chronic liver or kidney disease or ever had a stomach ulcer or GI bleeding, talk with your doctor before using these medicines.]  Follow Up  with you doctor in one week, or as advised by our staff, to be sure the bone is healing properly. If a splint was applied, it may be converted to a cast at your next visit.  [NOTE: A radiologist will review any X-rays that were taken. We will notify you of any new findings that may affect your care.]  Get Prompt Medical Attention  if any of the following occur:   The plaster cast or splint becomes wet or soft   The fiberglass cast or splint remains wet for more than 24 hours   Increased tightness or pain under the cast or splint   Toes become swollen, cold, blue, numb or tingly   2000-2014 [employer]. [address] [place], [place] [address]. All rights reserved. This information is not intended as a substitute for professional medical care. Always follow your healthcare professional's instructions.      Home today  Continue elevation and icing  Keep the splint on and dry.  Take aspirin 325 mg twice daily for DVT guard  Add tylenol up to 4000 mg daily for pain relief.  Scripts for OxyContin 10 mg, 20, one every 12 hours  if needed    Roxicodone 15 mg , 50, one every 4 hours if needed  Come to my office in about 12 days for a check up  Call for increased pain or fever, or concern.
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
## 2014-04-10 NOTE — Plan of Care (Signed)
Problem: Safety  Goal: Patient will be free from injury during hospitalization  Outcome: Progressing    Problem: Pain  Goal: Patient's pain/discomfort is manageable  Outcome: Progressing    Problem: Psychosocial and Spiritual Needs  Goal: Demonstrates ability to cope with hospitalization/illness  Outcome: Progressing    Problem: Potential for Compromised Skin Integrity  Goal: Skin integrity is maintained or improved  Assess and monitor skin integrity. Identify patients at risk for skin breakdown on admission and per policy. Collaborate with interdisciplinary team and initiate plans and interventions as needed.   Outcome: Progressing  Goal: Nutritional status is improving  Monitor and assess patient for malnutrition (ex- brittle hair, bruises, dry skin, pale skin and conjunctiva, muscle wasting, smooth red tongue, and disorientation). Collaborate with interdisciplinary team and initiate plan and interventions as ordered. Monitor patient's weight and dietary intake as ordered or per policy. Utilize nutrition screening tool and intervene per policy. Determine patient's food preferences and provide high-protein, high-caloric foods as appropriate.   Outcome: Progressing    Problem: Tissue integrity  Goal: Damaged tissue is healing and protected  Outcome: Progressing  Goal: Nutritional Status Improving  Outcome: Progressing    Problem: Urinary Incontinence  Goal: Perineal skin integrity is maintained or improved  Assess genitourinary system, perineal skin, labs (urinalysis), and history of incontinence to include past management, aggravating, and alleviating factors. Collaborate with interdisciplinary team and initiate plans and interventions as needed.   Outcome: Progressing
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
## 2014-10-14 ENCOUNTER — Encounter (HOSPITAL_COMMUNITY): Payer: Self-pay

## 2014-10-14 ENCOUNTER — Emergency Department (HOSPITAL_COMMUNITY): Payer: No Typology Code available for payment source

## 2014-10-14 ENCOUNTER — Emergency Department (HOSPITAL_COMMUNITY)
Admission: EM | Admit: 2014-10-14 | Discharge: 2014-10-14 | Disposition: A | Discharge status: Home / Self Care | Payer: No Typology Code available for payment source | Attending: Emergency Medicine | Admitting: Emergency Medicine

## 2014-10-14 DIAGNOSIS — S6992XA Unspecified injury of left wrist, hand and finger(s), initial encounter: Secondary | ICD-10-CM | POA: Diagnosis present

## 2014-10-14 DIAGNOSIS — Z880 Allergy status to penicillin: Secondary | ICD-10-CM | POA: Diagnosis not present

## 2014-10-14 DIAGNOSIS — Z87891 Personal history of nicotine dependence: Secondary | ICD-10-CM | POA: Insufficient documentation

## 2014-10-14 DIAGNOSIS — S60222A Contusion of left hand, initial encounter: Secondary | ICD-10-CM | POA: Insufficient documentation

## 2014-10-14 DIAGNOSIS — Y92410 Unspecified street and highway as the place of occurrence of the external cause: Secondary | ICD-10-CM | POA: Insufficient documentation

## 2014-10-14 DIAGNOSIS — S29012A Strain of muscle and tendon of back wall of thorax, initial encounter: Secondary | ICD-10-CM | POA: Diagnosis not present

## 2014-10-14 DIAGNOSIS — S4992XA Unspecified injury of left shoulder and upper arm, initial encounter: Secondary | ICD-10-CM | POA: Insufficient documentation

## 2014-10-14 DIAGNOSIS — S79911A Unspecified injury of right hip, initial encounter: Secondary | ICD-10-CM | POA: Diagnosis not present

## 2014-10-14 DIAGNOSIS — S29002A Unspecified injury of muscle and tendon of back wall of thorax, initial encounter: Secondary | ICD-10-CM | POA: Insufficient documentation

## 2014-10-14 DIAGNOSIS — Y998 Other external cause status: Secondary | ICD-10-CM | POA: Diagnosis not present

## 2014-10-14 DIAGNOSIS — Y9389 Activity, other specified: Secondary | ICD-10-CM | POA: Insufficient documentation

## 2014-10-14 DIAGNOSIS — S161XXA Strain of muscle, fascia and tendon at neck level, initial encounter: Secondary | ICD-10-CM | POA: Insufficient documentation

## 2014-10-14 DIAGNOSIS — T148XXA Other injury of unspecified body region, initial encounter: Secondary | ICD-10-CM

## 2014-10-14 NOTE — ED Notes (Signed)
Declined W/C at D/C and was escorted to lobby by RN. 
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
## 2014-12-29 ENCOUNTER — Emergency Department (HOSPITAL_COMMUNITY): Payer: Self-pay

## 2014-12-29 ENCOUNTER — Emergency Department (HOSPITAL_COMMUNITY)
Admission: EM | Admit: 2014-12-29 | Discharge: 2014-12-29 | Disposition: A | Discharge status: Home / Self Care | Payer: Self-pay | Attending: Emergency Medicine | Admitting: Emergency Medicine

## 2014-12-29 ENCOUNTER — Encounter (HOSPITAL_COMMUNITY): Payer: Self-pay | Admitting: *Deleted

## 2014-12-29 DIAGNOSIS — R531 Weakness: Secondary | ICD-10-CM

## 2014-12-29 DIAGNOSIS — Z880 Allergy status to penicillin: Secondary | ICD-10-CM | POA: Insufficient documentation

## 2014-12-29 DIAGNOSIS — Z79899 Other long term (current) drug therapy: Secondary | ICD-10-CM | POA: Insufficient documentation

## 2014-12-29 DIAGNOSIS — M6281 Muscle weakness (generalized): Secondary | ICD-10-CM | POA: Insufficient documentation

## 2014-12-29 DIAGNOSIS — Z87891 Personal history of nicotine dependence: Secondary | ICD-10-CM | POA: Insufficient documentation

## 2014-12-29 LAB — BASIC METABOLIC PANEL
Anion gap: 9 (ref 5–15)
BUN: 12 mg/dL (ref 6–20)
CO2: 25 mmol/L (ref 22–32)
Calcium: 9.5 mg/dL (ref 8.9–10.3)
Chloride: 104 mmol/L (ref 101–111)
Creatinine, Ser: 0.87 mg/dL (ref 0.61–1.24)
GFR calc Af Amer: >60 mL/min (ref >60)
GFR calc non Af Amer: >60 mL/min (ref >60)
GLUCOSE: 96 mg/dL (ref 70–99)
POTASSIUM: 4.3 mmol/L (ref 3.5–5.1)
Sodium: 138 mmol/L (ref 135–145)

## 2014-12-29 LAB — CBC
HEMATOCRIT: 46.2 % (ref 39.0–52.0)
Hemoglobin: 16 g/dL (ref 13.0–17.0)
MCH: 33.1 pg (ref 26.0–34.0)
MCHC: 34.6 g/dL (ref 30.0–36.0)
MCV: 95.5 fL (ref 78.0–100.0)
Platelets: 247 10*3/uL (ref 150–400)
RBC: 4.84 MIL/uL (ref 4.22–5.81)
RDW: 13.6 % (ref 11.5–15.5)
WBC: 7.7 10*3/uL (ref 4.0–10.5)

## 2014-12-29 LAB — I-STAT TROPONIN, ED
TROPONIN I, POC: 0.01 ng/mL (ref 0.00–0.08)
Troponin i, poc: 0 ng/mL (ref 0.00–0.08)

## 2014-12-29 LAB — BRAIN NATRIURETIC PEPTIDE: B NATRIURETIC PEPTIDE 5: 18.4 pg/mL (ref 0.0–100.0)

## 2014-12-29 NOTE — Discharge Instructions (Signed)
All the results in the ER are normal, labs and imaging. We are not sure what is causing your symptoms. The workup in the ER is not complete, and is limited to screening for life threatening and emergent conditions only, so please see a primary care doctor for further evaluation.   Weakness Weakness is a lack of strength. It may be felt all over the body (generalized) or in one specific part of the body (focal). Some causes of weakness can be serious. You may need further medical evaluation, especially if you are elderly or you have a history of immunosuppression (such as chemotherapy or HIV), kidney disease, heart disease, or diabetes. CAUSES  Weakness can be caused by many different things, including:  Infection.  Physical exhaustion.  Internal bleeding or other blood loss that results in a lack of red blood cells (anemia).  Dehydration. This cause is more common in elderly people.  Side effects or electrolyte abnormalities from medicines, such as pain medicines or sedatives.  Emotional distress, anxiety, or depression.  Circulation problems, especially severe peripheral arterial disease.  Heart disease, such as rapid atrial fibrillation, bradycardia, or heart failure.  Nervous system disorders, such as Guillain-Barr syndrome, multiple sclerosis, or stroke. DIAGNOSIS  To find the cause of your weakness, your caregiver will take your history and perform a physical exam. Lab tests or X-rays may also be ordered, if needed. TREATMENT  Treatment of weakness depends on the cause of your symptoms and can vary greatly. HOME CARE INSTRUCTIONS   Rest as needed.  Eat a well-balanced diet.  Try to get some exercise every day.  Only take over-the-counter or prescription medicines as directed by your caregiver. SEEK MEDICAL CARE IF:   Your weakness seems to be getting worse or spreads to other parts of your body.  You develop new aches or pains. SEEK IMMEDIATE MEDICAL CARE IF:    You cannot perform your normal daily activities, such as getting dressed and feeding yourself.  You cannot walk up and down stairs, or you feel exhausted when you do so.  You have shortness of breath or chest pain.  You have difficulty moving parts of your body.  You have weakness in only one area of the body or on only one side of the body.  You have a fever.  You have trouble speaking or swallowing.  You cannot control your bladder or bowel movements.  You have black or bloody vomit or stools. MAKE SURE YOU:  Understand these instructions.  Will watch your condition.  Will get help right away if you are not doing well or get worse. Document Released: 08/11/2005 Document Revised: 02/10/2012 Document Reviewed: 10/10/2011 [facility] Patient Information 2015 [place], [place]. This information is not intended to replace advice given to you by your health care provider. Make sure you discuss any questions you have with your health care provider.

## 2014-12-29 NOTE — ED Notes (Signed)
Pt reports sob, feels like he needs to cough. Having some chest discomfort that radiates into right neck and left arm.

## 2014-12-29 NOTE — ED Provider Notes (Signed)
CSN: [id number]     Arrival date & time 12/29/14  1122 History   First MD Initiated Contact with Patient 12/29/14 1143     Chief Complaint  Patient presents with  . Chest Pain     (Consider location/radiation/quality/duration/timing/severity/associated sxs/prior Treatment) HPI Comments: PT comes in with cc of chest pain, dib, cough and dizziness. Pt has been having some tightness in the midsternal region and L side intermittently for the past 3 months. Pt gets several episodes a day, the sx last for a few minutes and resolve. Pain is not exertional, pleuritic. Today the pain moved to the L arm and neck. Pt does endorse getting short of breath with exertion, but those symptoms are occasional. Pt has some palpitations as well. Occasionally pt has had some dizziness/lightheadedness/pressure in the head. He also c/o of generalized weakness - but reports that there are time when he is able to exert himself and have no symptoms - like have "vigorous sex." He has no medical hx, and is not taking any meds.  Patient is a 54 y.o. male presenting with chest pain. The history is provided by the patient.  Chest Pain   History reviewed. No pertinent past medical history. Past Surgical History  Procedure Laterality Date  . Appendectomy    . Ankle fracture surgery  august 2015    plates and screws    History reviewed. No pertinent family history. History  Substance Use Topics  . Smoking status: Former Research scientist (life sciences)  . Smokeless tobacco: Not on file  . Alcohol Use: No    Review of Systems  Cardiovascular: Positive for chest pain.  All other systems reviewed and are negative.     Allergies  Penicillins  Home Medications   Prior to Admission medications   Medication Sig Start Date End Date Taking? Authorizing Provider  Ascorbic Acid (VITAMIN C PO) Take 1 tablet by mouth daily.   Yes Historical Provider, MD  aspirin 325 MG tablet Take 325 mg by mouth once.   Yes Historical Provider, MD  COD  LIVER OIL PO Take 1 capsule by mouth daily.   Yes Historical Provider, MD  Saw Palmetto, Serenoa repens, (SAW PALMETTO PO) Take 1 tablet by mouth daily.   Yes Historical Provider, MD   BP 100/64 mmHg  Pulse 51  Temp(Src) 97.2 F (36.2 C) (Oral)  Resp 20  SpO2 98% Physical Exam  Constitutional: He is oriented to person, place, and time. He appears well-developed.  HENT:  Head: Normocephalic and atraumatic.  Eyes: Conjunctivae and EOM are normal. Pupils are equal, round, and reactive to light.  Neck: Normal range of motion. Neck supple.  Cardiovascular: Normal rate and regular rhythm.   Pulmonary/Chest: Effort normal and breath sounds normal.  Abdominal: Soft. Bowel sounds are normal. He exhibits no distension. There is no tenderness. There is no rebound and no guarding.  Neurological: He is alert and oriented to person, place, and time.  Skin: Skin is warm.  Nursing note and vitals reviewed.   ED Course  Procedures (including critical care time) Labs Review Labs Reviewed  CBC  BASIC METABOLIC PANEL  BRAIN NATRIURETIC PEPTIDE  ROCKY MTN SPOTTED FVR ABS PNL(IGG+IGM)  B. BURGDORFI ANTIBODIES  I-STAT TROPOININ, ED  Randolm Idol, ED    Imaging Review Dg Chest 2 View  12/29/2014   CLINICAL DATA:  Chest pain; SOB; Joint Pain  EXAM: CHEST  2 VIEW  COMPARISON:  12/19/2010  FINDINGS: The heart size and mediastinal contours are within normal limits. Both  lungs are clear. No pleural effusion or pneumothorax. The visualized skeletal structures are unremarkable.  IMPRESSION: No active cardiopulmonary disease.   Electronically Signed   By: Lajean Manes M.D.   On: 12/29/2014 12:43     EKG Interpretation   Date/Time:  Friday Dec 29 2014 11:27:46 EDT Ventricular Rate:  56 PR Interval:  134 QRS Duration: 84 QT Interval:  386 QTC Calculation: 372 R Axis:   80 Text Interpretation:  Sinus bradycardia Otherwise normal ECG No old  tracing to compare Confirmed by Murial Beam, MD, [facility]  [phone]) on 12/29/2014  11:45:50 AM      MDM   Final diagnoses:  Generalized weakness    Pt comes in with cc of generalized weakness. He has some atypical symptoms, constellation of symptoms - chest pain, dib, cough, myalgias, arthrlalgias, generalized weakness. Pt has had hx of tick bites, several, and is s/p treatment - but he reports not finishing the course of antibiotics. His sx are inconsistent - the cp, dib are exertional, but sometimes they are not exertional. Lyme and RMSF added, asked to have his pcp or himself f/u on the results if he doesn't hear from the hospital.   Varney Biles, MD 12/29/14 [phone]

## 2014-12-30 LAB — B. BURGDORFI ANTIBODIES

## 2015-01-02 ENCOUNTER — Telehealth (HOSPITAL_BASED_OUTPATIENT_CLINIC_OR_DEPARTMENT_OTHER): Payer: Self-pay | Admitting: Emergency Medicine

## 2015-01-02 LAB — ROCKY MTN SPOTTED FVR ABS PNL(IGG+IGM)
RMSF IGG: POSITIVE — AB
RMSF IGM: 0.22 {index} (ref 0.00–0.89)

## 2015-01-02 LAB — RMSF, IGG, IFA

## 2015-08-25 ENCOUNTER — Emergency Department (HOSPITAL_COMMUNITY): Payer: Self-pay

## 2015-08-25 ENCOUNTER — Encounter (HOSPITAL_COMMUNITY): Payer: Self-pay

## 2015-08-25 ENCOUNTER — Emergency Department (HOSPITAL_COMMUNITY)
Admission: EM | Admit: 2015-08-25 | Discharge: 2015-08-25 | Disposition: A | Discharge status: Home / Self Care | Payer: Self-pay | Attending: Emergency Medicine | Admitting: Emergency Medicine

## 2015-08-25 DIAGNOSIS — R519 Headache, unspecified: Secondary | ICD-10-CM

## 2015-08-25 DIAGNOSIS — Z79899 Other long term (current) drug therapy: Secondary | ICD-10-CM | POA: Insufficient documentation

## 2015-08-25 DIAGNOSIS — H9190 Unspecified hearing loss, unspecified ear: Secondary | ICD-10-CM | POA: Insufficient documentation

## 2015-08-25 DIAGNOSIS — Z87891 Personal history of nicotine dependence: Secondary | ICD-10-CM | POA: Insufficient documentation

## 2015-08-25 DIAGNOSIS — G8929 Other chronic pain: Secondary | ICD-10-CM | POA: Insufficient documentation

## 2015-08-25 DIAGNOSIS — H9201 Otalgia, right ear: Secondary | ICD-10-CM | POA: Insufficient documentation

## 2015-08-25 DIAGNOSIS — R51 Headache: Secondary | ICD-10-CM | POA: Insufficient documentation

## 2015-08-25 DIAGNOSIS — Z880 Allergy status to penicillin: Secondary | ICD-10-CM | POA: Insufficient documentation

## 2015-08-25 DIAGNOSIS — Z7982 Long term (current) use of aspirin: Secondary | ICD-10-CM | POA: Insufficient documentation

## 2015-08-25 DIAGNOSIS — M79606 Pain in leg, unspecified: Secondary | ICD-10-CM | POA: Insufficient documentation

## 2015-08-25 DIAGNOSIS — R079 Chest pain, unspecified: Secondary | ICD-10-CM | POA: Insufficient documentation

## 2015-08-25 LAB — CBC
HCT: 43.8 % (ref 39.0–52.0)
Hemoglobin: 15.1 g/dL (ref 13.0–17.0)
MCH: 32.1 pg (ref 26.0–34.0)
MCHC: 34.5 g/dL (ref 30.0–36.0)
MCV: 93 fL (ref 78.0–100.0)
Platelets: 248 10*3/uL (ref 150–400)
RBC: 4.71 MIL/uL (ref 4.22–5.81)
RDW: 13.4 % (ref 11.5–15.5)
WBC: 6.2 10*3/uL (ref 4.0–10.5)

## 2015-08-25 LAB — BASIC METABOLIC PANEL
Anion gap: 10 (ref 5–15)
BUN: 17 mg/dL (ref 6–20)
CALCIUM: 9.5 mg/dL (ref 8.9–10.3)
CO2: 27 mmol/L (ref 22–32)
CREATININE: 0.91 mg/dL (ref 0.61–1.24)
Chloride: 102 mmol/L (ref 101–111)
GFR calc non Af Amer: >60 mL/min (ref >60)
Glucose, Bld: 103 mg/dL — ABNORMAL HIGH (ref 65–99)
Potassium: 3.9 mmol/L (ref 3.5–5.1)
Sodium: 139 mmol/L (ref 135–145)

## 2015-08-25 LAB — I-STAT TROPONIN, ED: TROPONIN I, POC: 0 ng/mL (ref 0.00–0.08)

## 2015-08-25 MED ORDER — NAPROXEN 500 MG PO TABS: 500.0000 mg | ORAL_TABLET | Freq: Two times a day (BID) | ORAL | Status: DC

## 2015-08-25 NOTE — ED Notes (Signed)
Pt stable, ambulatory, states understanding of discharge instructions 

## 2015-08-25 NOTE — Discharge Instructions (Signed)
Take the naprosyn, one of the better anti-inflammatory medicine. See your Doctor as requested. ENT information provided.   Pain Without a Known Cause WHAT IS PAIN WITHOUT A KNOWN CAUSE? Pain can occur in any part of the body and can range from mild to severe. Sometimes no cause can be found for why you are having pain. Some types of pain that can occur without a known cause include:   Headache.  Back pain.  Abdominal pain.  Neck pain. HOW IS PAIN WITHOUT A KNOWN CAUSE DIAGNOSED?  Your health care provider will try to find the cause of your pain. This may include:  Physical exam.  Medical history.  Blood tests.  Urine tests.  X-rays. If no cause is found, your health care provider may diagnose you with pain without a known cause.  IS THERE TREATMENT FOR PAIN WITHOUT A CAUSE?  Treatment depends on the kind of pain you have. Your health care provider may prescribe medicines to help relieve your pain.  WHAT CAN I DO AT HOME FOR MY PAIN?   Take medicines only as directed by your health care provider.  Stop any activities that cause pain. During periods of severe pain, bed rest may help.  Try to reduce your stress with activities such as yoga or meditation. Talk to your health care provider for other stress-reducing activity recommendations.  Exercise regularly, if approved by your health care provider.  Eat a healthy diet that includes fruits and vegetables. This may improve pain. Talk to your health care provider if you have any questions about your diet. WHAT IF MY PAIN DOES NOT GET BETTER?  If you have a painful condition and no reason can be found for the pain or the pain gets worse, it is important to follow up with your health care provider. It may be necessary to repeat tests and look further for a possible cause.    This information is not intended to replace advice given to you by your health care provider. Make sure you discuss any questions you have with your health  care provider.   Document Released: 05/06/2001 Document Revised: 09/01/2014 Document Reviewed: 12/27/2013 Elsevier Interactive Patient Education [employer].

## 2015-08-25 NOTE — ED Provider Notes (Signed)
CSN: [id number]     Arrival date & time 08/25/15  1218 History   First MD Initiated Contact with Patient 08/25/15 1511     Chief Complaint  Patient presents with  . Chest Pain  . Leg Pain  . Headache     (Consider location/radiation/quality/duration/timing/severity/associated sxs/prior Treatment) HPI Comments: Pt comes in with cc of headaches, earache, elevated BP and chest discomfort x 4 days. Pt has no medical hx. +RMSF hx. Pt reports that he has been having chest tightness, generalized x 4 days, constantly today. Pain is not worse with ambulation, inspiration, palpation. Pt has also been feeling throbbing in his head, behind his eyes, in his throat. PT had some blurry vision as well, which has resolved. PT also c/o leg pain, he had surgery at that site. Symptoms are chronic and intermittent - he is able to run on them on some days and other times walking is hard. The headache has been R sided, with earache. Constant x 3 years. Started after lake swimming. Some hearing loss. States family has same hx, but he is concerned that maybe he has an infection. Just got insurance now.   ROS 10 Systems reviewed and are negative for acute change except as noted in the HPI.     Patient is a 54 y.o. male presenting with chest pain, leg pain, and headaches. The history is provided by the patient.  Chest Pain Associated symptoms: headache   Leg Pain Headache   History reviewed. No pertinent past medical history. Past Surgical History  Procedure Laterality Date  . Appendectomy    . Ankle fracture surgery  august 2015    plates and screws    History reviewed. No pertinent family history. Social History  Substance Use Topics  . Smoking status: Former Research scientist (life sciences)  . Smokeless tobacco: None  . Alcohol Use: No    Review of Systems  Cardiovascular: Positive for chest pain.  Neurological: Positive for headaches.      Allergies  Penicillins  Home Medications   Prior to Admission  medications   Medication Sig Start Date End Date Taking? Authorizing Provider  Ascorbic Acid (VITAMIN C PO) Take 1 tablet by mouth daily.   Yes Historical Provider, MD  aspirin 325 MG tablet Take 325 mg by mouth once.   Yes Historical Provider, MD  b complex vitamins capsule Take 1 capsule by mouth daily.   Yes Historical Provider, MD  COD LIVER OIL PO Take 1 capsule by mouth daily.   Yes Historical Provider, MD  Saw Palmetto, Serenoa repens, (SAW PALMETTO PO) Take 1 tablet by mouth daily.   Yes Historical Provider, MD  naproxen (NAPROSYN) 500 MG tablet Take 1 tablet (500 mg total) by mouth 2 (two) times daily. 08/25/15   Elizabethann Lackey, MD   BP 127/80 mmHg  Pulse 55  Temp(Src) 97.5 F (36.4 C) (Oral)  Resp 15  SpO2 100% Physical Exam  Constitutional: He is oriented to person, place, and time. He appears well-developed.  HENT:  Head: Normocephalic and atraumatic.  Right Ear: External ear normal.  Left Ear: External ear normal.  Eyes: Conjunctivae and EOM are normal. Pupils are equal, round, and reactive to light.  Neck: Normal range of motion. Neck supple.  Cardiovascular: Normal rate and regular rhythm.   Pulmonary/Chest: Effort normal and breath sounds normal.  Abdominal: Soft. Bowel sounds are normal. He exhibits no distension. There is no tenderness. There is no rebound and no guarding.  Musculoskeletal: He exhibits no edema.  Neurological:  He is alert and oriented to person, place, and time.  Skin: Skin is warm. No rash noted.  Nursing note and vitals reviewed.   ED Course  Procedures (including critical care time) Labs Review Labs Reviewed  BASIC METABOLIC PANEL - Abnormal; Notable for the following:    Glucose, Bld 103 (*)    All other components within normal limits  CBC  I-STAT TROPOININ, ED    Imaging Review Dg Chest 2 View  08/25/2015  CLINICAL DATA:  Initial encounter for hypertension starting today. EXAM: CHEST  2 VIEW COMPARISON:  12/29/2014. FINDINGS:  Lungs are hyperexpanded. The lungs are clear wiithout focal pneumonia, edema, pneumothorax or pleural effusion. The cardiopericardial silhouette is within normal limits for size. The visualized bony structures of the thorax are intact. IMPRESSION: Hyperexpansion raises the question of underlying emphysema. No acute cardiopulmonary process. Electronically Signed   By: Misty Stanley M.D.   On: 08/25/2015 14:54   I have personally reviewed and evaluated these images and lab results as part of my medical decision-making.   EKG Interpretation   Date/Time:  Saturday August 25 2015 12:34:23 EST Ventricular Rate:  63 PR Interval:  136 QRS Duration: 86 QT Interval:  392 QTC Calculation: 401 R Axis:   81 Text Interpretation:  Normal sinus rhythm Normal ECG normal axis  normal  interval  No significant change since last tracing Confirmed by Jakeb Lamping,  MD, [facility] Comp [phone]) on 08/25/2015 3:22:26 PM      MDM   Final diagnoses:  Chronic nonintractable headache, unspecified headache type    Pt comes in with cc of several vague, non emergent complains. EKG is normal. Labs are normal. Ear exam is normal. Cardiac exam is normal, pulses are equal. Leg has no signs of infection.  Advised pcp f/u. ENT info given for his hearing loss.    Varney Biles, MD 08/25/15 [phone]

## 2015-08-25 NOTE — ED Notes (Signed)
Pt reports onset this morning headache, feels like pressure, right eye vision is blurred.  Onset 4 days chest pain, constant past 2 days.  Onset 1.5 weeks ago left leg pain (previous surgery),   No nausea, vomiting, shortness of breath.  Pt reports he takes frequent antibiotics for teeth issues, last use 1 month ago.  Pt is concerned for infection around heart.

## 2015-10-26 ENCOUNTER — Encounter: Payer: Self-pay | Admitting: Neurology

## 2015-10-26 ENCOUNTER — Ambulatory Visit (INDEPENDENT_AMBULATORY_CARE_PROVIDER_SITE_OTHER): Payer: BLUE CROSS/BLUE SHIELD | Admitting: Neurology

## 2015-10-26 VITALS — BP 100/70 | HR 65 | Resp 16 | Wt 144.0 lb

## 2015-10-26 DIAGNOSIS — H93A9 Pulsatile tinnitus, unspecified ear: Secondary | ICD-10-CM | POA: Diagnosis not present

## 2015-10-26 DIAGNOSIS — R404 Transient alteration of awareness: Secondary | ICD-10-CM | POA: Diagnosis not present
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
## 2015-10-26 NOTE — Patient Instructions (Addendum)
1. Schedule MRI brain with and without contrast, MRA head without contrast 2. Schedule 1-hour EEG, if normal, we will plan for a 48-hour EEG 3. As per [place] driving laws, if you lose consciousness, one should not drive until 6 months event-free 4. Follow-up after the tests

## 2015-11-05 ENCOUNTER — Ambulatory Visit (HOSPITAL_COMMUNITY)
Admission: RE | Admit: 2015-11-05 | Discharge: 2015-11-05 | Disposition: A | Discharge status: Home / Self Care | Payer: BLUE CROSS/BLUE SHIELD | Source: Ambulatory Visit | Attending: Neurology | Admitting: Neurology

## 2015-11-05 DIAGNOSIS — H93A9 Pulsatile tinnitus, unspecified ear: Secondary | ICD-10-CM | POA: Diagnosis present

## 2015-11-05 DIAGNOSIS — R404 Transient alteration of awareness: Secondary | ICD-10-CM | POA: Insufficient documentation

## 2015-11-05 MED ORDER — GADOBENATE DIMEGLUMINE 529 MG/ML IV SOLN
15.0000 mL | Freq: Once | INTRAVENOUS | Status: AC | PRN
  Administered 2015-11-05: 13 mL via INTRAVENOUS

## 2015-11-07 ENCOUNTER — Ambulatory Visit (INDEPENDENT_AMBULATORY_CARE_PROVIDER_SITE_OTHER): Payer: BLUE CROSS/BLUE SHIELD | Admitting: Neurology

## 2015-11-07 DIAGNOSIS — R404 Transient alteration of awareness: Secondary | ICD-10-CM

## 2015-11-09 ENCOUNTER — Telehealth: Payer: Self-pay | Admitting: Family Medicine

## 2015-11-09 NOTE — Procedures (Signed)
ELECTROENCEPHALOGRAM REPORT  Date of Study: 11/07/2015  Patient's Name: Jonathan [name] MRN: [id number] Date of Birth: September 27, 1960  Referring Provider: Dr. Ellouise Newer  Clinical History: This is a 55 year old man with recurrent spells where he feels he would black out, vision becomes blurred, he also reports a metallic taste in his mouth, as well as pressure on the right temporal region with pulsatile tinnitus  Medications: Vitamin C, B-complex vitamins, Cod liver oil, Saw Geophysicist/field seismologist Summary: A multichannel digital 1-hour sleep-deprived EEG recording measured by the international 10-20 system with electrodes applied with paste and impedances below 5000 ohms performed in our laboratory with EKG monitoring in an awake and asleep patient.  Hyperventilation and photic stimulation were performed.  The digital EEG was referentially recorded, reformatted, and digitally filtered in a variety of bipolar and referential montages for optimal display.    Description: The patient is awake and asleep during the recording.  During maximal wakefulness, there is a symmetric, medium voltage 9-9.5 Hz posterior dominant rhythm that attenuates with eye opening.  The record is symmetric.  During drowsiness and sleep, there is an increase in theta slowing of the background.  Vertex waves and symmetric sleep spindles were seen.  Hyperventilation and photic stimulation did not elicit any abnormalities.  There were no epileptiform discharges or electrographic seizures seen.    EKG lead was unremarkable.  Impression: This 1-hour awake and asleep EEG is normal.    Clinical Correlation: A normal EEG does not exclude a clinical diagnosis of epilepsy.  If further clinical questions remain, prolonged EEG may be helpful.  Clinical correlation is advised.   Ellouise Newer, M.D.

## 2015-11-09 NOTE — Telephone Encounter (Signed)
-----   Message from Cameron Sprang, MD sent at 11/09/2015  1:10 PM EDT ----- Pls let him know I reviewed MRI brain and it is normal, no evidence of tumor, stroke, bleed, or aneurysm. Thanks

## 2015-11-09 NOTE — Telephone Encounter (Signed)
Patient notified of results. He said he would let us know about 48 hour eeg. I advised him to call us back if he wanted to get scheduled for that.

## 2015-11-09 NOTE — Telephone Encounter (Signed)
-----   Message from Cameron Sprang, MD sent at 11/09/2015  1:09 PM EDT ----- Pls let him know EEG is normal, would proceed with 48-hour EEG. Thanks

## 2015-11-14 ENCOUNTER — Other Ambulatory Visit: Payer: BLUE CROSS/BLUE SHIELD

## 2015-11-28 ENCOUNTER — Encounter: Payer: Self-pay | Admitting: Neurology

## 2015-11-28 ENCOUNTER — Ambulatory Visit (INDEPENDENT_AMBULATORY_CARE_PROVIDER_SITE_OTHER): Payer: BLUE CROSS/BLUE SHIELD | Admitting: Neurology

## 2015-11-28 ENCOUNTER — Ambulatory Visit: Payer: BLUE CROSS/BLUE SHIELD | Admitting: Neurology

## 2015-11-28 VITALS — BP 90/60 | HR 72 | Resp 16 | Wt 147.0 lb

## 2015-11-28 DIAGNOSIS — R404 Transient alteration of awareness: Secondary | ICD-10-CM
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
## 2015-11-28 NOTE — Patient Instructions (Signed)
1. Proceed with 48-hour EEG 2. If you would like a second opinion, we would be happy to refer you to [facility], let us know 3. Follow-up in 6 months

## 2015-12-11 ENCOUNTER — Ambulatory Visit (HOSPITAL_COMMUNITY)
Admission: RE | Admit: 2015-12-11 | Discharge: 2015-12-11 | Disposition: A | Discharge status: Home / Self Care | Payer: BLUE CROSS/BLUE SHIELD | Source: Ambulatory Visit | Attending: Internal Medicine | Admitting: Internal Medicine

## 2015-12-11 ENCOUNTER — Other Ambulatory Visit (HOSPITAL_COMMUNITY): Payer: Self-pay | Admitting: Internal Medicine

## 2015-12-11 DIAGNOSIS — M11262 Other chondrocalcinosis, left knee: Secondary | ICD-10-CM | POA: Diagnosis not present

## 2015-12-11 DIAGNOSIS — Z09 Encounter for follow-up examination after completed treatment for conditions other than malignant neoplasm: Secondary | ICD-10-CM | POA: Insufficient documentation

## 2015-12-11 DIAGNOSIS — M1712 Unilateral primary osteoarthritis, left knee: Secondary | ICD-10-CM | POA: Diagnosis not present

## 2015-12-18 DIAGNOSIS — J342 Deviated nasal septum: Secondary | ICD-10-CM | POA: Insufficient documentation

## 2015-12-18 DIAGNOSIS — J343 Hypertrophy of nasal turbinates: Secondary | ICD-10-CM | POA: Insufficient documentation

## 2015-12-31 ENCOUNTER — Other Ambulatory Visit: Payer: BLUE CROSS/BLUE SHIELD

## 2015-12-31 DIAGNOSIS — Z029 Encounter for administrative examinations, unspecified: Secondary | ICD-10-CM

## 2016-05-27 ENCOUNTER — Ambulatory Visit: Payer: BLUE CROSS/BLUE SHIELD | Admitting: Neurology

## 2016-05-27 DIAGNOSIS — Z029 Encounter for administrative examinations, unspecified: Secondary | ICD-10-CM

## 2016-07-31 ENCOUNTER — Encounter: Payer: Self-pay | Admitting: Gastroenterology

## 2016-09-15 ENCOUNTER — Ambulatory Visit (AMBULATORY_SURGERY_CENTER): Payer: Self-pay | Admitting: *Deleted

## 2016-09-15 VITALS — Ht 68.0 in | Wt 153.0 lb

## 2016-09-15 DIAGNOSIS — Z1211 Encounter for screening for malignant neoplasm of colon: Secondary | ICD-10-CM

## 2016-09-15 MED ORDER — NA SULFATE-K SULFATE-MG SULF 17.5-3.13-1.6 GM/177ML PO SOLN: ORAL | 0 refills | Status: DC

## 2016-09-15 NOTE — Progress Notes (Signed)
Patient denies any allergies to eggs or soy. Patient states he is hard to come out of anesthesia. Patient denies any oxygen use at home and does not take any diet/weight loss medications.

## 2016-09-19 ENCOUNTER — Encounter: Payer: Self-pay | Admitting: Gastroenterology

## 2016-09-22 ENCOUNTER — Telehealth: Payer: Self-pay | Admitting: Gastroenterology

## 2016-09-22 NOTE — Telephone Encounter (Signed)
Called patient to inform that I called in a discount card for Suprep and his cost is only $50. [facility] said they would have to order it and it would be in tomorrow.  He agreed to the $50 discount card and the cost of his prep     BIN [id number] PCN [id number] ID [id number]

## 2016-09-29 ENCOUNTER — Encounter: Payer: BLUE CROSS/BLUE SHIELD | Admitting: Gastroenterology

## 2016-10-13 ENCOUNTER — Telehealth: Payer: Self-pay | Admitting: Gastroenterology

## 2016-10-13 NOTE — Telephone Encounter (Signed)
ok 

## 2016-10-15 ENCOUNTER — Encounter: Payer: BLUE CROSS/BLUE SHIELD | Admitting: Gastroenterology

## 2016-11-11 IMAGING — MR MR MRA HEAD W/O CM
10 of 12 series · 29 of 48 positions shown · IV contrast (13    MULTIHANCE)
Comparison: None.

CLINICAL DATA: Pulsatile tinnitus.  Mental status changes.

EXAM:
MRI HEAD WITHOUT AND WITH CONTRAST
MRA HEAD WITHOUT CONTRAST
TECHNIQUE: Multiplanar, multiecho pulse sequences of the brain and surrounding
structures were obtained without and with intravenous contrast.
Angiographic images of the head were obtained using MRA technique
without contrast.
CONTRAST:  13mL MULTIHANCE GADOBENATE DIMEGLUMINE 529 MG/ML IV SOLN

[Series 4: (id) mt fs · axial · 1.4mm · 0.39mm/px · z∈[-13,+50]mm · 5 of 162 slices shown]
[im 1/162]
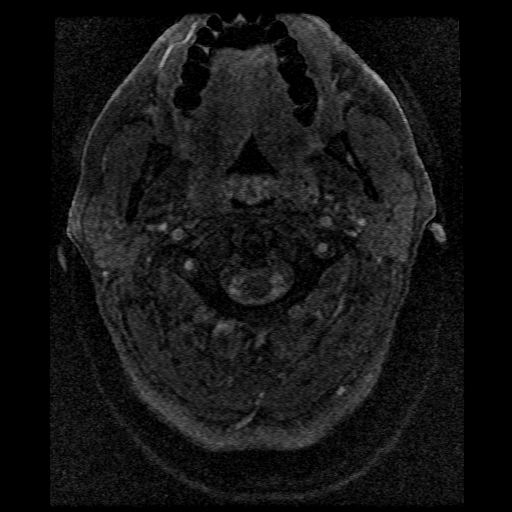
[im 27/162]
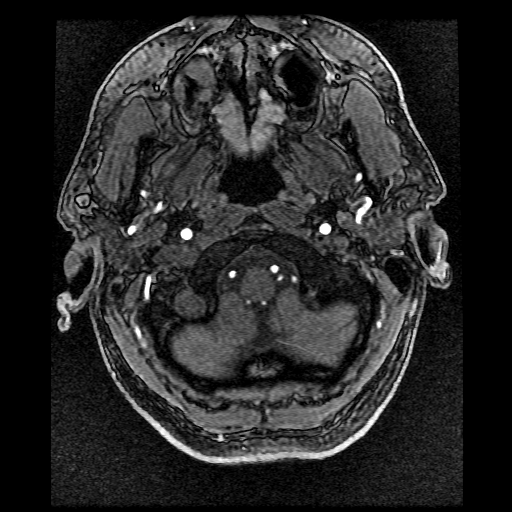
[im 54/162]
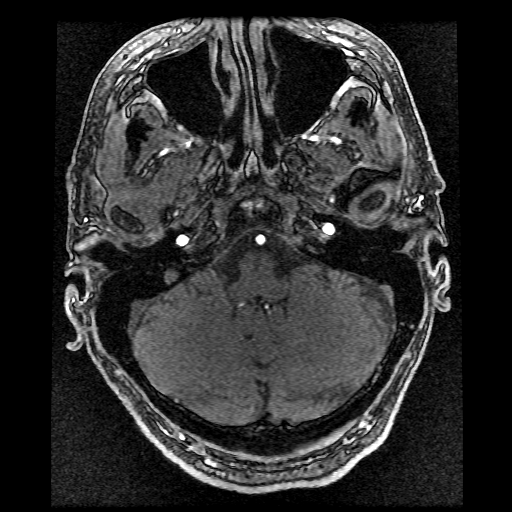
[im 68/162]
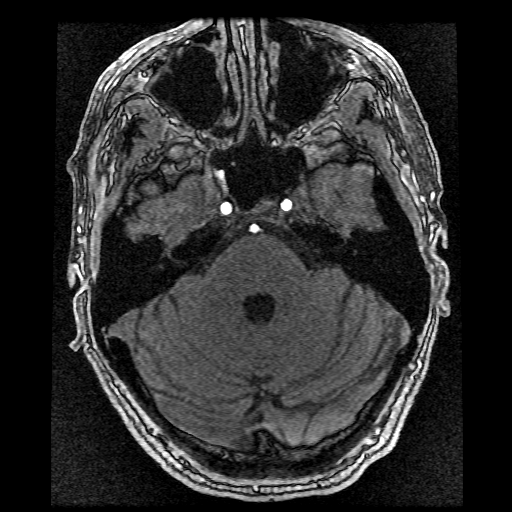
[im 94/162]
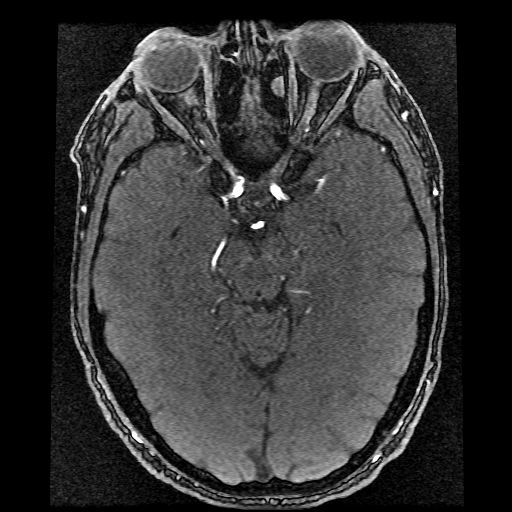

[Series 5: T1 · sagittal · 5.0mm · 0.47mm/px · 2 of 23 slices shown (1 of 3)]
[im 1/23]
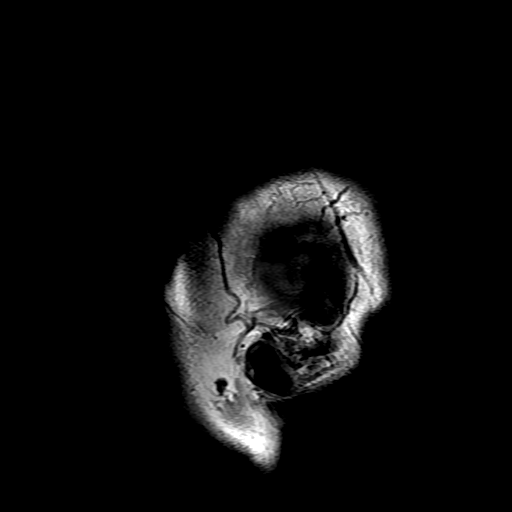
[im 23/23]
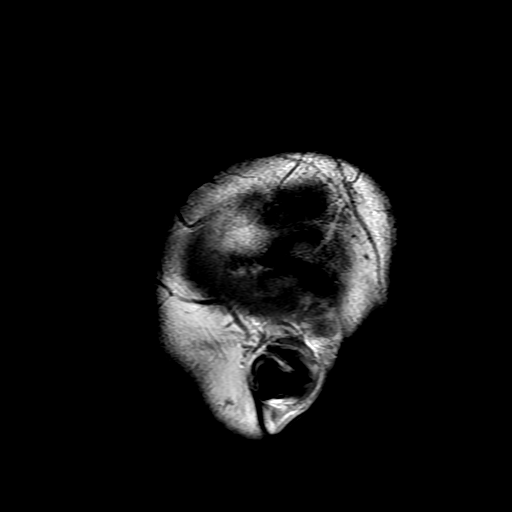

[Series 6: DWI · axial · 3.0mm · 1.09mm/px · z∈[-19,+136]mm · 9 of 108 slices shown (1 of 2)]
[im 1/108]
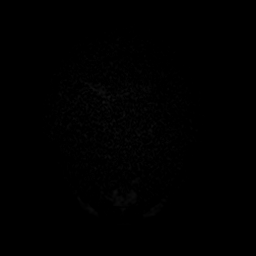
[im 14/108]
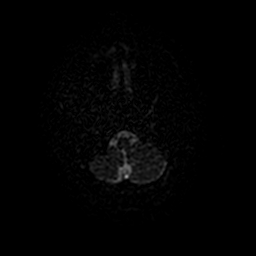
[im 27/108]
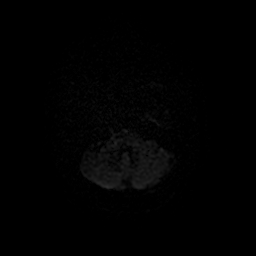
[im 41/108]
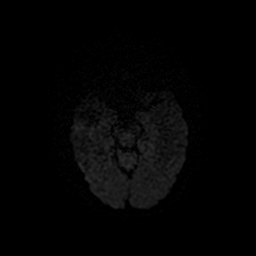
[im 54/108]
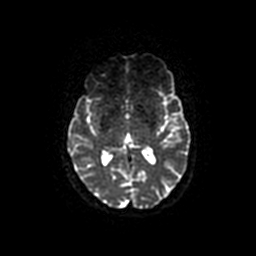
[im 67/108]
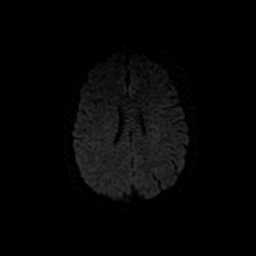
[im 81/108]
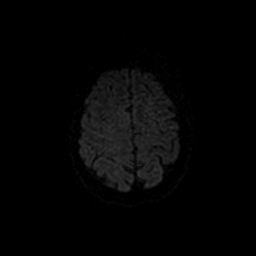
[im 94/108]
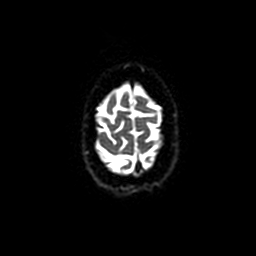
[im 108/108]
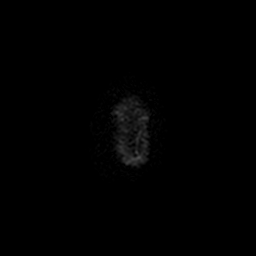

[Series 7: T2 · axial · 5.0mm · 0.43mm/px · z∈[+4,+155]mm · 2 of 23 slices shown]
[im 1/23]
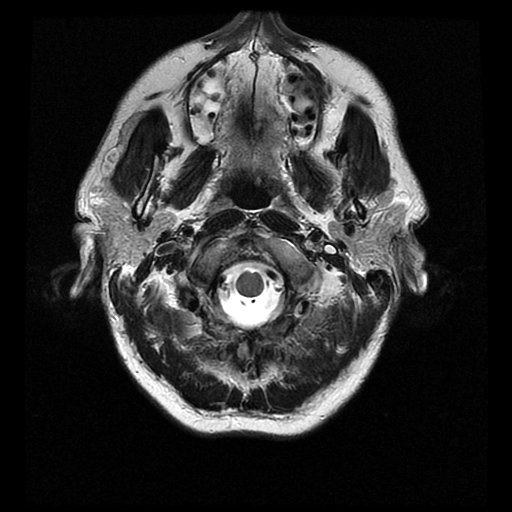
[im 23/23]
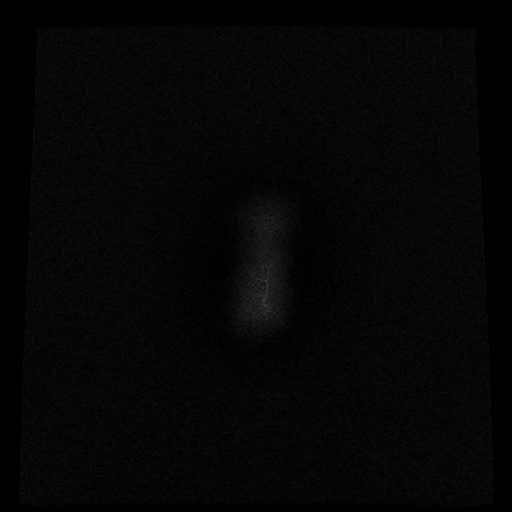

[Series 8: FLAIR · axial · 5.0mm · 0.43mm/px · z∈[-5,+146]mm · 2 of 23 slices shown]
[im 1/23]
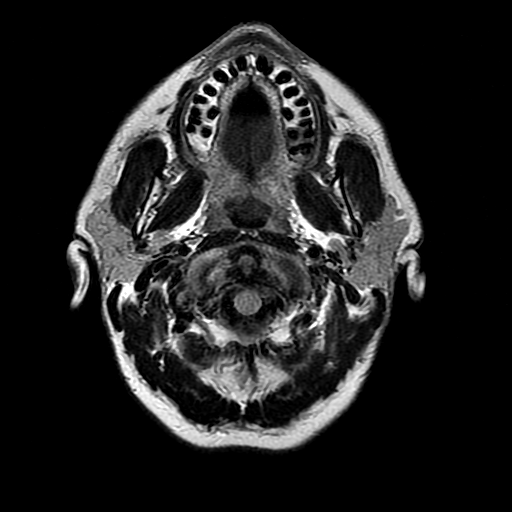
[im 23/23]
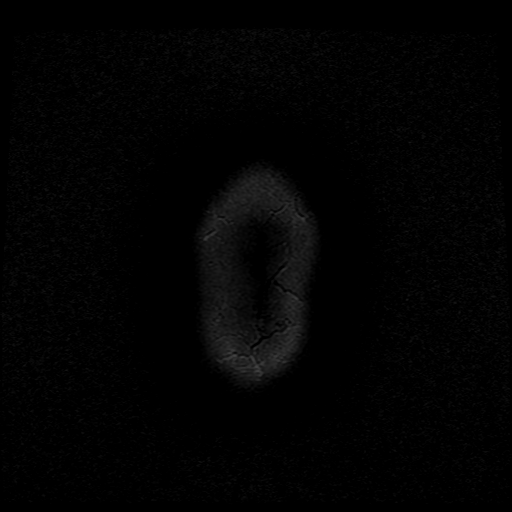

[Series 10: T1 · axial · 3.0mm · 0.35mm/px · 1 of 12 slices shown (2 of 3)]
[im 1/12]
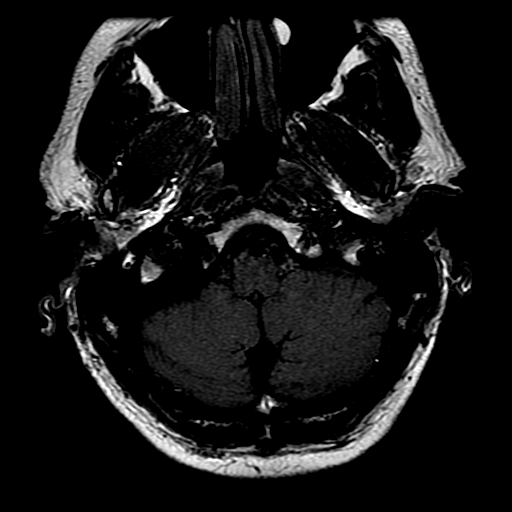

[Series 11: T1 · coronal · 3.0mm · 0.35mm/px · 1 of 12 slices shown (3 of 3)]
[im 1/12]
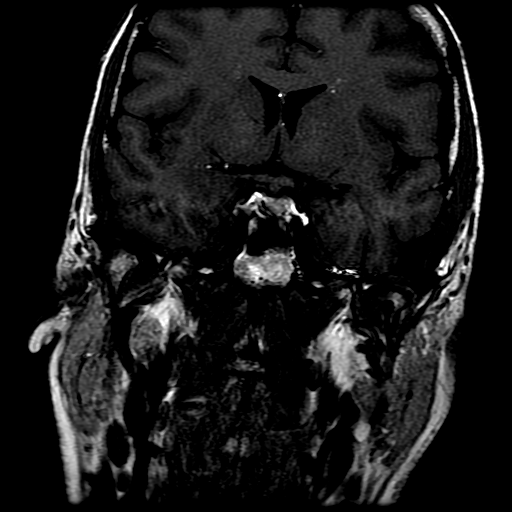

[Series 13: T1 post-contrast · axial · 3.0mm · 0.35mm/px · 1 of 12 slices shown (1 of 2)]
[im 1/12]
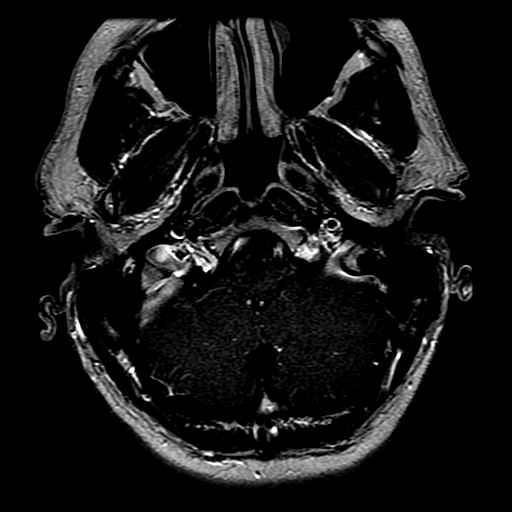

[Series 14: T1 post-contrast · coronal · 3.0mm · 0.35mm/px · 1 of 12 slices shown (2 of 2)]
[im 1/12]
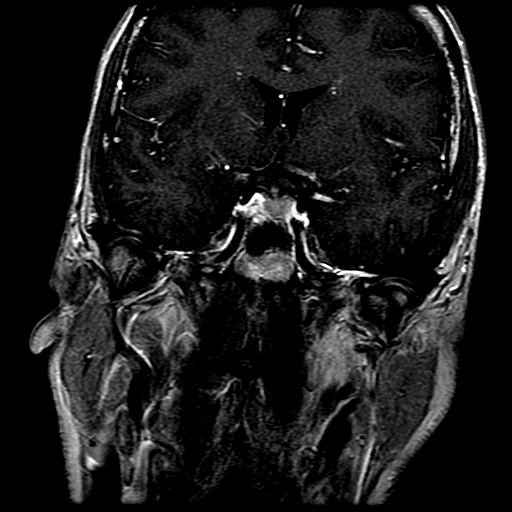

[Series 600: DWI · axial · 3.0mm · 1.09mm/px · z∈[-19,+136]mm · 5 of 54 slices shown (2 of 2)]
[im 1/54]
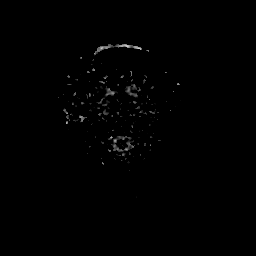
[im 14/54]
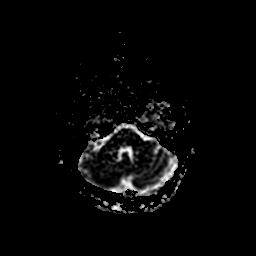
[im 27/54]
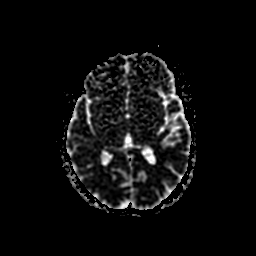
[im 40/54]
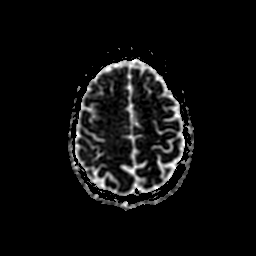
[im 54/54]
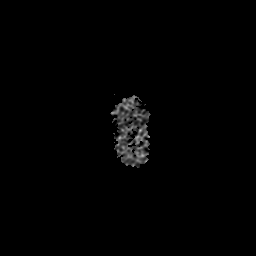

[29 of 48 positions shown; findings below may reference images not displayed]

FINDINGS: MRI HEAD FINDINGS

The brain has normal appearance on all pulse sequences without
evidence of malformation, atrophy, old or acute infarction, mass
lesion, hemorrhage, hydrocephalus or extra-axial collection. CP
angle regions are normal. Seventh and eighth nerve complexes are
normal. No abnormal vascular structures. No abnormal contrast
enhancement. Major venous sinuses appear patent and normal. No fluid
in the middle ears or mastoids.

MRA HEAD FINDINGS

Both internal carotid arteries are widely patent into the brain. No
siphon stenosis. The anterior and middle cerebral vessels are normal
without proximal stenosis, aneurysm or vascular malformation. Both
vertebral arteries are widely patent to the basilar. No basilar
stenosis. Posterior circulation branch vessels are normal.
IMPRESSION: Normal MRI of the brain and normal intracranial MR angiography. No
abnormality seen to explain pulsatile tinnitus or mental status
changes.

## 2016-12-08 ENCOUNTER — Telehealth: Payer: Self-pay | Admitting: Gastroenterology

## 2016-12-08 NOTE — Telephone Encounter (Signed)
No charge. Thanks.  

## 2016-12-09 ENCOUNTER — Encounter: Payer: BLUE CROSS/BLUE SHIELD | Admitting: Gastroenterology

## 2016-12-17 IMAGING — CR DG TIBIA/FIBULA 2V*L*
4 series · 4 of 4 positions shown · non-contrast
Comparison: None.

CLINICAL DATA: Injured 2 years ago, recent pain and swelling

EXAM:
LEFT TIBIA AND FIBULA - 2 VIEW

[x tib-fib ap left (1 of 2)]
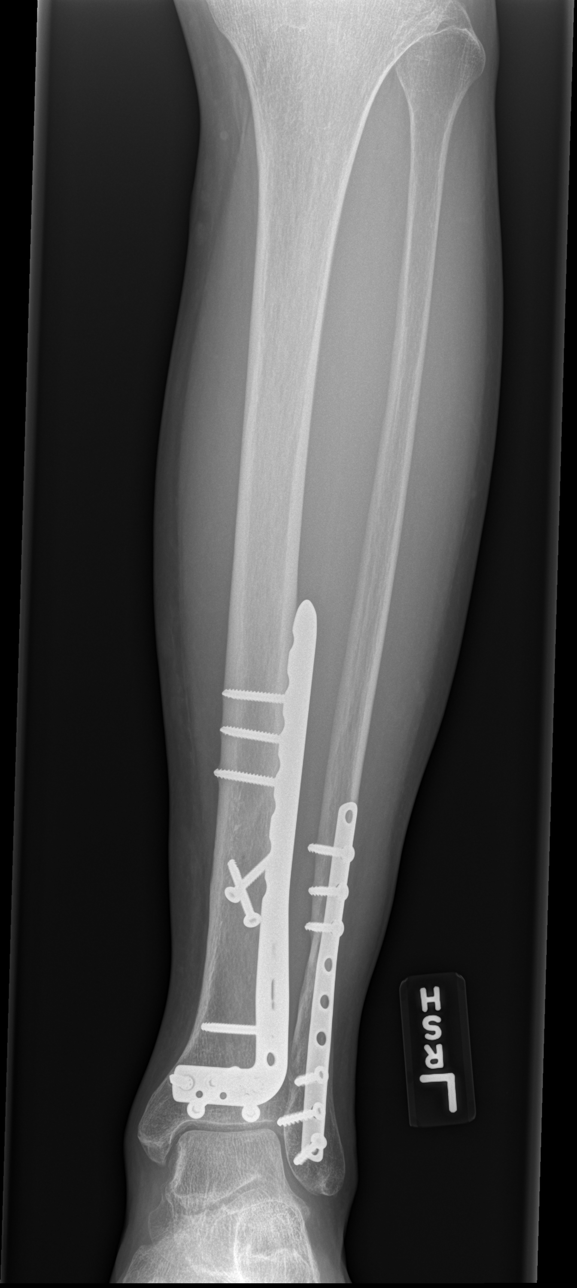

[x tib-fib ap left (2 of 2)]
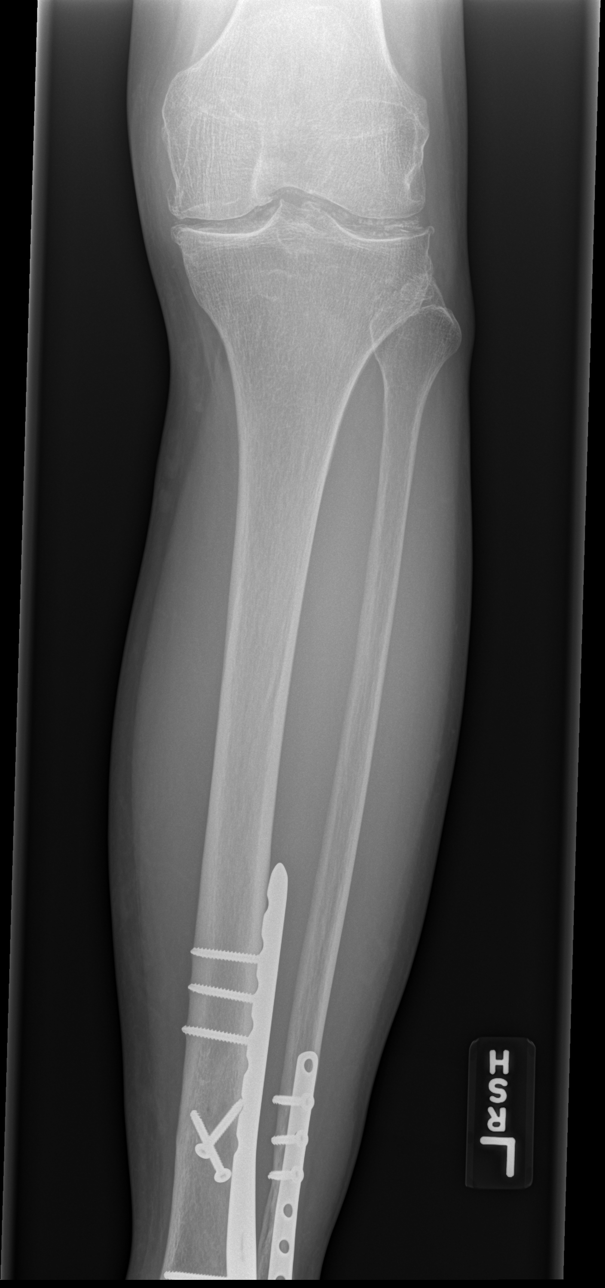

[x tib-fib lat left (1 of 2)]
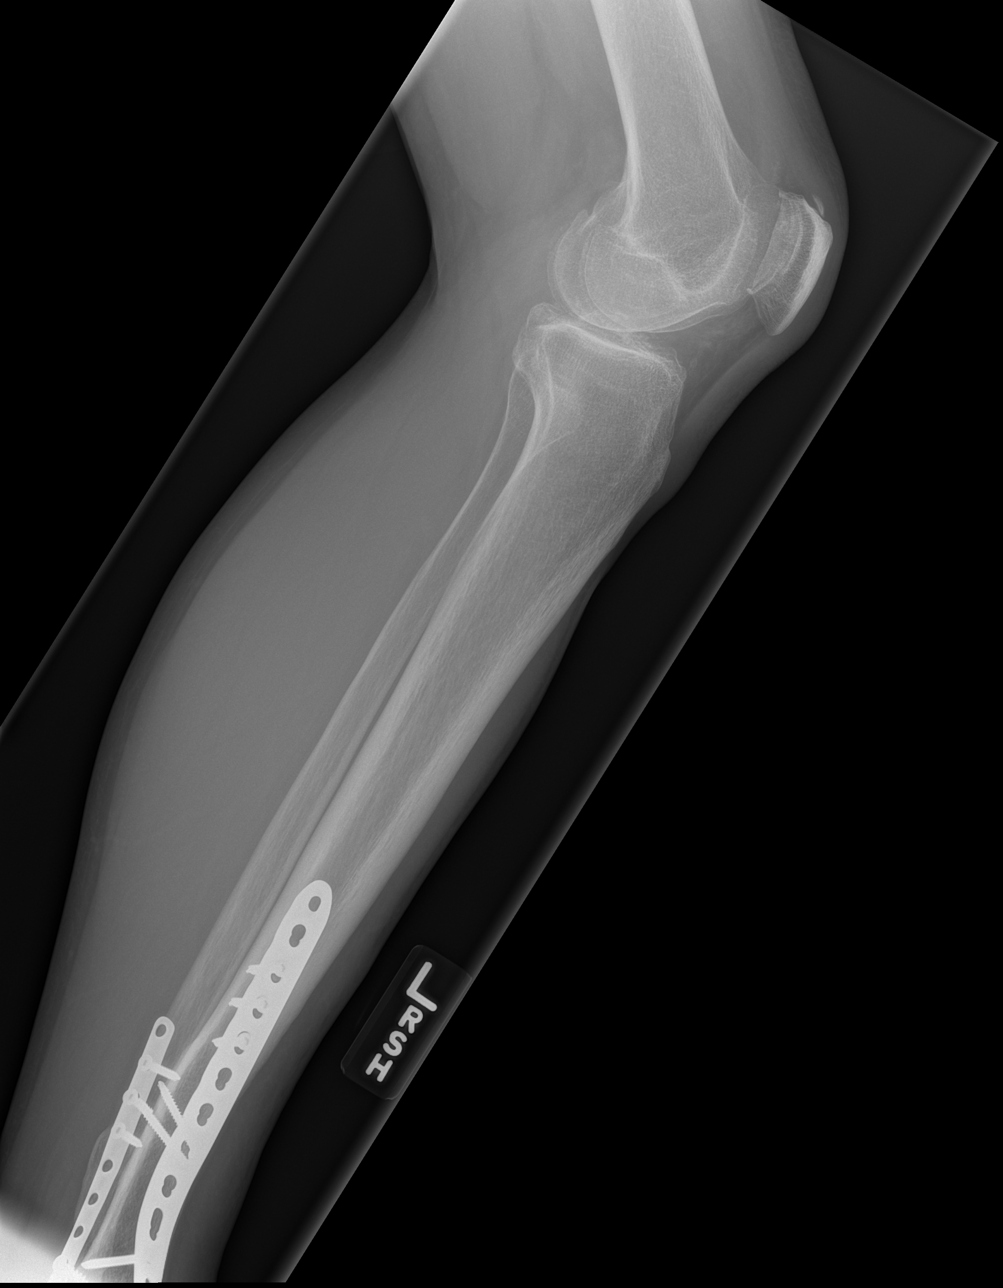

[x tib-fib lat left (2 of 2)]
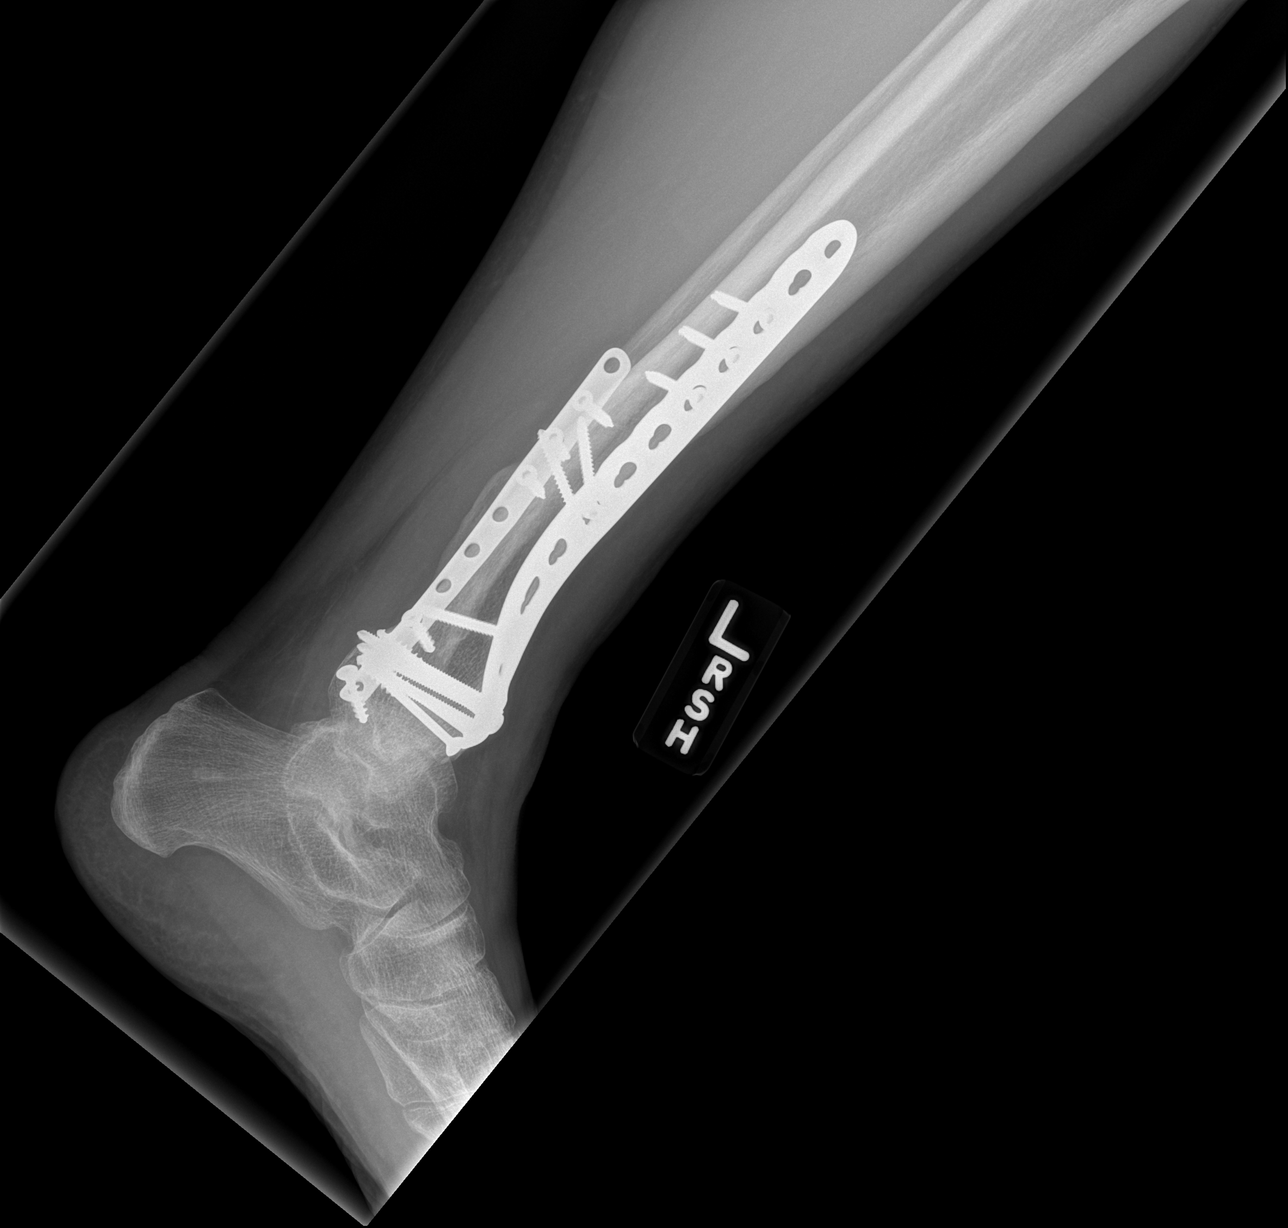

[4 of 4 positions shown; findings below may reference images not displayed]

FINDINGS: Views of the left tibia and fibula were obtained. Plate and screw
fixation of the distal left tibia and distal fibula are noted from
prior fixation of fractures. No acute fracture is seen. There does
appear to be tricompartmental degenerative joint disease of the left
knee. There is chondrocalcinosis of the left knee as well which may
indicate CPPD arthropathy. The ankle joint is unremarkable.
IMPRESSION: 1. Hardware for prior fixation of distal left tibial and fibular
fractures. No acute abnormality.
2. Tricompartmental degenerative joint disease the left knee.
3. Chondrocalcinosis of the left knee may indicate CPPD arthropathy.

## 2016-12-24 ENCOUNTER — Ambulatory Visit (AMBULATORY_SURGERY_CENTER): Payer: BLUE CROSS/BLUE SHIELD | Admitting: Gastroenterology

## 2016-12-24 ENCOUNTER — Encounter: Payer: Self-pay | Admitting: Gastroenterology

## 2016-12-24 VITALS — BP 88/44 | HR 54 | Temp 98.4°F | Resp 8 | Ht 68.0 in | Wt 153.0 lb

## 2016-12-24 DIAGNOSIS — D123 Benign neoplasm of transverse colon: Secondary | ICD-10-CM

## 2016-12-24 DIAGNOSIS — D122 Benign neoplasm of ascending colon: Secondary | ICD-10-CM | POA: Diagnosis not present

## 2016-12-24 DIAGNOSIS — D120 Benign neoplasm of cecum: Secondary | ICD-10-CM

## 2016-12-24 DIAGNOSIS — Z1212 Encounter for screening for malignant neoplasm of rectum: Secondary | ICD-10-CM | POA: Diagnosis not present

## 2016-12-24 DIAGNOSIS — Z1211 Encounter for screening for malignant neoplasm of colon: Secondary | ICD-10-CM

## 2016-12-24 MED ORDER — SODIUM CHLORIDE 0.9 % IV SOLN
500.0000 mL | INTRAVENOUS | Status: AC
Start: 2016-12-24 — End: ?

## 2016-12-24 NOTE — Patient Instructions (Signed)
YOU HAD AN ENDOSCOPIC PROCEDURE TODAY AT THE [facility]:   Refer to the procedure report that was given to you for any specific questions about what was found during the examination.  If the procedure report does not answer your questions, please call your gastroenterologist to clarify.  If you requested that your care partner not be given the details of your procedure findings, then the procedure report has been included in a sealed envelope for you to review at your convenience later.  YOU SHOULD EXPECT: Some feelings of bloating in the abdomen. Passage of more gas than usual.  Walking can help get rid of the air that was put into your GI tract during the procedure and reduce the bloating. If you had a lower endoscopy (such as a colonoscopy or flexible sigmoidoscopy) you may notice spotting of blood in your stool or on the toilet paper. If you underwent a bowel prep for your procedure, you may not have a normal bowel movement for a few days.  Please Note:  You might notice some irritation and congestion in your nose or some drainage.  This is from the oxygen used during your procedure.  There is no need for concern and it should clear up in a day or so.  SYMPTOMS TO REPORT IMMEDIATELY:   Following lower endoscopy (colonoscopy or flexible sigmoidoscopy):  Excessive amounts of blood in the stool  Significant tenderness or worsening of abdominal pains  Swelling of the abdomen that is new, acute  Fever of 100F or higher  For urgent or emergent issues, a gastroenterologist can be reached at any hour by calling [phone].   DIET:  We do recommend a small meal at first, but then you may proceed to your regular diet.  Drink plenty of fluids but you should avoid alcoholic beverages for 24 hours.  ACTIVITY:  You should plan to take it easy for the rest of today and you should NOT DRIVE or use heavy machinery until tomorrow (because of the sedation medicines used during the test).     FOLLOW UP: Our staff will call the number listed on your records the next business day following your procedure to check on you and address any questions or concerns that you may have regarding the information given to you following your procedure. If we do not reach you, we will leave a message.  However, if you are feeling well and you are not experiencing any problems, there is no need to return our call.  We will assume that you have returned to your regular daily activities without incident.  If any biopsies were taken you will be contacted by phone or by letter within the next 1-3 weeks.  Please call us at [phone] if you have not heard about the biopsies in 3 weeks.    SIGNATURES/CONFIDENTIALITY: You and/or your care partner have signed paperwork which will be entered into your electronic medical record.  These signatures attest to the fact that that the information above on your After Visit Summary has been reviewed and is understood.  Full responsibility of the confidentiality of this discharge information lies with you and/or your care-partner  Polyp, diverticulosis, and hemorrhoid information given.. 

## 2016-12-24 NOTE — Progress Notes (Signed)
To PACU, vss patent aw report to rn 

## 2016-12-24 NOTE — Op Note (Signed)
[facility] Patient Name: Jonathan [name] Procedure Date: 12/24/2016 2:48 PM MRN: [id number] Endoscopist: Mauri Pole , MD Age: 56 Referring MD:  Date of Birth: 25-Mar-1961 Gender: Male Account #: [id number] Procedure:                Colonoscopy Indications:              Screening for colorectal malignant neoplasm, This                            is the patient's first colonoscopy Medicines:                Monitored Anesthesia Care Procedure:                Pre-Anesthesia Assessment:                           - Prior to the procedure, a History and Physical                            was performed, and patient medications and                            allergies were reviewed. The patient's tolerance of                            previous anesthesia was also reviewed. The risks                            and benefits of the procedure and the sedation                            options and risks were discussed with the patient.                            All questions were answered, and informed consent                            was obtained. Prior Anticoagulants: The patient has                            taken no previous anticoagulant or antiplatelet                            agents. ASA Grade Assessment: II - A patient with                            mild systemic disease. After reviewing the risks                            and benefits, the patient was deemed in                            satisfactory condition to undergo the procedure.  After obtaining informed consent, the colonoscope                            was passed under direct vision. Throughout the                            procedure, the patient's blood pressure, pulse, and                            oxygen saturations were monitored continuously. The                            Colonoscope was introduced through the anus and                            advanced to the the  terminal ileum, with                            identification of the appendiceal orifice and IC                            valve. The colonoscopy was performed without                            difficulty. The patient tolerated the procedure                            well. The quality of the bowel preparation was                            excellent. The terminal ileum, ileocecal valve,                            appendiceal orifice, and rectum were photographed. Scope In: 2:59:52 PM Scope Out: 3:15:46 PM Scope Withdrawal Time: 0 hours 13 minutes 11 seconds  Total Procedure Duration: 0 hours 15 minutes 54 seconds  Findings:                 The perianal and digital rectal examinations were                            normal.                           Two sessile polyps were found in the ascending                            colon and cecum. The polyps were 5 to 9 mm in size.                            These polyps were removed with a cold snare.                            Resection and retrieval were complete.  A 1 mm polyp was found in the transverse colon. The                            polyp was sessile. The polyp was removed with a                            cold biopsy forceps. Resection and retrieval were                            complete.                           A few small-mouthed diverticula were found in the                            sigmoid colon.                           Non-bleeding internal hemorrhoids were found during                            retroflexion. The hemorrhoids were small.                           The exam was otherwise without abnormality. Complications:            No immediate complications. Estimated Blood Loss:     Estimated blood loss was minimal. Impression:               - Two 5 to 9 mm polyps in the ascending colon and                            in the cecum, removed with a cold snare. Resected                             and retrieved.                           - One 1 mm polyp in the transverse colon, removed                            with a cold biopsy forceps. Resected and retrieved.                           - Diverticulosis in the sigmoid colon.                           - Non-bleeding internal hemorrhoids.                           - The examination was otherwise normal. Recommendation:           - Patient has a contact number available for                            emergencies.  The signs and symptoms of potential                            delayed complications were discussed with the                            patient. Return to normal activities tomorrow.                            Written discharge instructions were provided to the                            patient.                           - Resume previous diet.                           - Continue present medications.                           - Await pathology results.                           - Repeat colonoscopy date to be determined after                            pending pathology results are reviewed for                            surveillance based on pathology results. Mauri Pole, MD 12/24/2016 3:27:03 PM This report has been signed electronically.

## 2016-12-24 NOTE — Progress Notes (Signed)
Called to room to assist during endoscopic procedure.  Patient ID and intended procedure confirmed with present staff. Received instructions for my participation in the procedure from the performing physician.  

## 2016-12-25 ENCOUNTER — Telehealth: Payer: Self-pay | Admitting: *Deleted

## 2016-12-25 NOTE — Telephone Encounter (Signed)
  Follow up Call-  Call back number 12/24/2016  Post procedure Call Back phone  # [phone]  Permission to leave phone message Yes  Some recent data might be hidden     Patient questions:  Do you have a fever, pain , or abdominal swelling? No. Pain Score  0 *  Have you tolerated food without any problems? Yes.    Have you been able to return to your normal activities? Yes.    Do you have any questions about your discharge instructions: Diet   No. Medications  No. Follow up visit  No.  Do you have questions or concerns about your Care? No.  Actions: * If pain score is 4 or above: No action needed, pain <4.

## 2016-12-31 ENCOUNTER — Encounter: Payer: Self-pay | Admitting: Gastroenterology

## 2017-02-08 NOTE — Progress Notes (Signed)
Cardiology Office Note   Date:  02/11/2017   ID:  Jonathan [name], DOB 1960/09/09, MRN [id number]  PCP:  Antonietta Jewel, MD  Cardiologist:   Jenkins Rouge, MD   No chief complaint on file.     History of Present Illness: Jonathan [name] is a 56 y.o. male who presents for consultation regarding bradycardia and palpitations. Referred by Dr Sheryle Hail [facility]. Tried to review his note from 01/27/17 but frankly it was illegible. ECG done SB rate 52 no other acute changes. Labs reviewed remarkable for LDL 131 K 4.7 Cr .9 normal LFTls Hct 44.5   Hs seen Dr Delice Lesch for near syncope preceded by right temporal pressure and tinnitus. MRI/MRA normal EEG was normal ? From URI when head clears up with antibiotics feels better. Feeling of black out been going on for 1.5 years Told from anxiety Thinks he may have blacked out while sleeping ?? Sometimes BP up and feels his pulse is irregular Started taking Scram which rids body of parasites and feels better.   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 Hyperlipidemia     Past Surgical History:  Procedure Laterality Date  . ANKLE FRACTURE SURGERY Left august 2015   plates and screws   . APPENDECTOMY    . KNEE ARTHROSCOPY       Current Outpatient Prescriptions  Medication Sig Dispense Refill  . Ascorbic Acid (VITAMIN C PO) Take 1 tablet by mouth daily.    Marland Kitchen b complex vitamins capsule Take 1 capsule by mouth daily.    . COD LIVER OIL PO Take 1 capsule by mouth daily.    . Multiple Minerals (CALCIUM/MAGNESIUM/ZINC PO) Take 1 tablet by mouth daily.    . Saw Palmetto, Serenoa repens, (SAW PALMETTO PO) Take 1 tablet by mouth daily.     Current Facility-Administered Medications  Medication Dose Route Frequency Provider Last Rate Last Dose  . 0.9 %  sodium chloride infusion  500 mL  Intravenous Continuous Nandigam, Venia Minks, MD        Allergies:   Penicillins    Social History:  The patient  reports that he has quit smoking. He has never used smokeless tobacco. He reports that he drinks alcohol. He reports that he does not use drugs.   Family History:  The patient's family history includes Breast cancer in his mother; Cancer in his mother; Diabetes in his father; Other in his maternal grandfather.    ROS:  Please see the history of present illness.   Otherwise, review of systems are positive for none.   All other systems are reviewed and negative.    PHYSICAL EXAM: VS:  BP 90/62   Pulse (!) 51   Ht 5\' 8"  (1.727 m)   Wt 62.3 kg (137 lb 6.4 oz)   BMI 20.89 kg/m  , BMI Body mass index is 20.89 kg/m. Affect appropriate Healthy:  appears stated age 62: hearing aids  Neck supple with no adenopathy JVP normal no bruits no thyromegaly Lungs clear with no wheezing and good diaphragmatic motion Heart:  S1/S2 no murmur, no rub, gallop or click PMI normal Abdomen: benighn, BS positve, no tenderness, no AAA no bruit.  No HSM or HJR Distal pulses intact with no bruits No edema Neuro non-focal Skin warm and dry No  muscular weakness    EKG:  08/26/15  SR rate 63 normal ECG  02/11/17  SR rate 51 normal    Recent Labs: No results found for requested labs within last 8760 hours.    Lipid Panel No results found for: CHOL, TRIG, HDL, CHOLHDL, VLDL, LDLCALC, LDLDIRECT    Wt Readings from Last 3 Encounters:  02/11/17 62.3 kg (137 lb 6.4 oz)  12/24/16 69.4 kg (153 lb)  09/15/16 69.4 kg (153 lb)      Other studies Reviewed: Additional studies/ records that were reviewed today include: Notes neurlogy and primary labs and EEG, MRI , MRA.    ASSESSMENT AND PLAN:  1.  Bradycardia/Palpitations ECG normal f/u event monitor no indication for pacer presently 2. Pre Syncope doubt cardiac etiology Echo r/o structural heart disease f/u neuro 3 Cholesterol  Saw  palmetto and diet Rx at this point no documented vascular disease   Current medicines are reviewed at length with the patient today.  The patient does not have concerns regarding medicines.  The following changes have been made:  no change  Labs/ tests ordered today include: Event monitor and Echo   Orders Placed This Encounter  Procedures  . Cardiac event monitor  . EKG 12-Lead  . ECHOCARDIOGRAM COMPLETE     Disposition:   FU with me after tests      Signed, Jenkins Rouge, MD  02/11/2017 1:00 PM    [address], [place], [place]  [address] Phone: [phone]; Fax: [phone]

## 2017-02-11 ENCOUNTER — Ambulatory Visit (INDEPENDENT_AMBULATORY_CARE_PROVIDER_SITE_OTHER): Payer: BLUE CROSS/BLUE SHIELD | Admitting: Cardiovascular Disease

## 2017-02-11 ENCOUNTER — Encounter: Payer: Self-pay | Admitting: Cardiovascular Disease

## 2017-02-11 VITALS — BP 90/62 | HR 51 | Ht 68.0 in | Wt 137.4 lb

## 2017-02-11 DIAGNOSIS — R55 Syncope and collapse: Secondary | ICD-10-CM

## 2017-02-11 DIAGNOSIS — R002 Palpitations: Secondary | ICD-10-CM | POA: Diagnosis not present

## 2017-02-11 DIAGNOSIS — R001 Bradycardia, unspecified: Secondary | ICD-10-CM

## 2017-02-11 NOTE — Patient Instructions (Addendum)
Medication Instructions:  Your physician recommends that you continue on your current medications as directed. Please refer to the Current Medication list given to you today.  Labwork: NONE  Testing/Procedures: Your physician has recommended that you wear an event monitor. Event monitors are medical devices that record the heart's electrical activity. Doctors most often [place] these monitors to diagnose arrhythmias. Arrhythmias are problems with the speed or rhythm of the heartbeat. The monitor is a small, portable device. You can wear one while you do your normal daily activities. This is usually used to diagnose what is causing palpitations/syncope (passing out).  Your physician has requested that you have an echocardiogram. Echocardiography is a painless test that uses sound waves to create images of your heart. It provides your doctor with information about the size and shape of your heart and how well your heart's chambers and valves are working. This procedure takes approximately one hour. There are no restrictions for this procedure.  Follow-Up: Your physician wants you to follow-up in: 2 to 3 months with Dr. Johnsie Cancel.     If you need a refill on your cardiac medications before your next appointment, please call your pharmacy.

## 2017-02-24 ENCOUNTER — Ambulatory Visit (INDEPENDENT_AMBULATORY_CARE_PROVIDER_SITE_OTHER): Payer: BLUE CROSS/BLUE SHIELD

## 2017-02-24 ENCOUNTER — Ambulatory Visit (HOSPITAL_COMMUNITY): Payer: BLUE CROSS/BLUE SHIELD | Attending: Cardiovascular Disease

## 2017-02-24 DIAGNOSIS — R001 Bradycardia, unspecified: Secondary | ICD-10-CM

## 2017-02-24 DIAGNOSIS — R002 Palpitations: Secondary | ICD-10-CM

## 2017-02-24 DIAGNOSIS — R55 Syncope and collapse: Secondary | ICD-10-CM | POA: Diagnosis not present

## 2017-03-10 ENCOUNTER — Other Ambulatory Visit: Payer: Self-pay

## 2017-03-10 ENCOUNTER — Ambulatory Visit (HOSPITAL_COMMUNITY): Payer: BLUE CROSS/BLUE SHIELD | Attending: Cardiology

## 2017-03-10 DIAGNOSIS — I071 Rheumatic tricuspid insufficiency: Secondary | ICD-10-CM | POA: Insufficient documentation

## 2017-03-10 DIAGNOSIS — R001 Bradycardia, unspecified: Secondary | ICD-10-CM

## 2017-03-10 DIAGNOSIS — R55 Syncope and collapse: Secondary | ICD-10-CM | POA: Diagnosis not present

## 2017-03-10 DIAGNOSIS — R002 Palpitations: Secondary | ICD-10-CM

## 2017-03-24 ENCOUNTER — Telehealth: Payer: Self-pay | Admitting: Cardiovascular Disease

## 2017-03-24 NOTE — Telephone Encounter (Signed)
New Message ° ° pt verbalized that he is returning call for rn  °

## 2017-03-24 NOTE — Telephone Encounter (Signed)
Called patient with echo results.

## 2017-05-18 NOTE — Progress Notes (Signed)
Cardiology Office Note   Date:  05/20/2017   ID:  Jonathan [name], DOB Sep 05, 1960, MRN [id number]  PCP:  Antonietta Jewel, MD  Cardiologist:   Jenkins Rouge, MD   No chief complaint on file.     History of Present Illness: Jonathan [name] is a 56 y.o. male first seen in June for  consultation regarding bradycardia and palpitations. Referred by Dr Sheryle Hail [facility]. Tried to review his note from 01/27/17 but frankly it was illegible. ECG done SB rate 52 no other acute changes. Labs reviewed remarkable for LDL 131 K 4.7 Cr .9 normal LFTls Hct 44.5   Hs seen Dr Delice Lesch for near syncope preceded by right temporal pressure and tinnitus. MRI/MRA normal EEG was normal ? From URI when head clears up with antibiotics feels better. Feeling of black out been going on for 1.5 years Told from anxiety Thinks he may have blacked out while sleeping ?? Sometimes BP up and feels his pulse is irregular Started taking Scram which rids body of parasites and feels better.   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 Systolic function was   normal. The estimated ejection fraction was 55%. Wall motion was   normal; there were no regional wall motion abnormalities. Left   ventricular diastolic function parameters were normal. - Aortic valve: There was no regurgitation. - Aortic root: The aortic root was normal in size. - Ascending aorta: The ascending aorta was normal in size. - Right ventricle: Systolic function was normal. - Right atrium: The atrium was normal in size. - Tricuspid valve: There was mild regurgitation. - Pulmonic valve: There was no regurgitation. - Pulmonary arteries: Systolic pressure was within the normal   range. - Inferior  vena cava: The vessel was normal in size. - Pericardium, extracardiac: There was no pericardial effusion.   Event monitor with rare PAC;s / PVC;s no significant arrhythmias  He still has congested feeling in chest and occasional palpitations. Note related to stress   Past Medical History:  Diagnosis Date  . Hyperlipidemia     Past Surgical History:  Procedure Laterality Date  . ANKLE FRACTURE SURGERY Left august 2015   plates and screws   . APPENDECTOMY    . KNEE ARTHROSCOPY       Current Outpatient Prescriptions  Medication Sig Dispense Refill  . Ascorbic Acid (VITAMIN C PO) Take 1 tablet by mouth daily.    Marland Kitchen b complex vitamins capsule Take 1 capsule by mouth daily.    . COD LIVER OIL PO Take 1 capsule by mouth daily.    . Multiple Minerals (CALCIUM/MAGNESIUM/ZINC PO) Take 1 tablet by mouth daily.    . Saw Palmetto, Serenoa repens, (SAW PALMETTO PO) Take 1 tablet by mouth daily.     Current Facility-Administered Medications  Medication Dose Route Frequency Provider Last Rate Last Dose  . 0.9 %  sodium chloride infusion  500 mL Intravenous Continuous Nandigam, Venia Minks, MD        Allergies:   Penicillins    Social History:  The patient  reports that he has quit smoking. He has never used smokeless tobacco. He reports that he drinks alcohol. He reports that he does not use drugs.  Family History:  The patient's family history includes Breast cancer in his mother; Cancer in his mother; Diabetes in his father; Other in his maternal grandfather.    ROS:  Please see the history of present illness.   Otherwise, review of systems are positive for none.   All other systems are reviewed and negative.    PHYSICAL EXAM: VS:  BP 100/62   Pulse (!) 56   Ht 5\' 8"  (1.727 m)   Wt 142 lb 6.4 oz (64.6 kg)   SpO2 99%   BMI 21.65 kg/m  , BMI Body mass index is 21.65 kg/m.  Affect appropriate Healthy:  appears stated age 28: normal Neck supple with no adenopathy JVP normal  no bruits no thyromegaly Lungs clear with no wheezing and good diaphragmatic motion Heart:  S1/S2 no murmur, no rub, gallop or click PMI normal Abdomen: benighn, BS positve, no tenderness, no AAA no bruit.  No HSM or HJR Distal pulses intact with no bruits No edema Neuro non-focal Skin warm and dry No muscular weakness     EKG:  08/26/15  SR rate 63 normal ECG  02/11/17  SR rate 51 normal    Recent Labs: No results found for requested labs within last 8760 hours.    Lipid Panel No results found for: CHOL, TRIG, HDL, CHOLHDL, VLDL, LDLCALC, LDLDIRECT    Wt Readings from Last 3 Encounters:  05/20/17 142 lb 6.4 oz (64.6 kg)  02/11/17 137 lb 6.4 oz (62.3 kg)  12/24/16 153 lb (69.4 kg)      Other studies Reviewed: Additional studies/ records that were reviewed today include: Notes neurlogy and primary labs and EEG, MRI , MRA.    ASSESSMENT AND PLAN:  1.  Bradycardia/Palpitations ECG normal no indication for pacer presently 2. Pre Syncope doubt cardiac etiology Echo and event monitor benign observe  3 Cholesterol  Saw palmetto and diet Rx at this point no documented vascular disease  Will order ETT since he rides motorcycle and r/o exercise induced arrhythmia and make Sure HR response to exercise is normal given persistent symptoms   Current medicines are reviewed at length with the patient today.  The patient does not have concerns regarding medicines.  The following changes have been made:  no change  Labs/ tests ordered today include: None   Orders Placed This Encounter  Procedures  . EXERCISE TOLERANCE TEST     Disposition:   FU with me after tests      Signed, Jenkins Rouge, MD  05/20/2017 8:44 AM    [facility] [address], [place], [place]  [address] Phone: [phone]; Fax: [phone]

## 2017-05-20 ENCOUNTER — Ambulatory Visit (INDEPENDENT_AMBULATORY_CARE_PROVIDER_SITE_OTHER): Payer: BLUE CROSS/BLUE SHIELD | Admitting: Cardiovascular Disease

## 2017-05-20 ENCOUNTER — Encounter: Payer: Self-pay | Admitting: Cardiovascular Disease

## 2017-05-20 VITALS — BP 100/62 | HR 56 | Ht 68.0 in | Wt 142.4 lb

## 2017-05-20 DIAGNOSIS — I493 Ventricular premature depolarization: Secondary | ICD-10-CM | POA: Diagnosis not present

## 2017-05-20 DIAGNOSIS — R002 Palpitations: Secondary | ICD-10-CM | POA: Diagnosis not present

## 2017-05-20 DIAGNOSIS — R001 Bradycardia, unspecified: Secondary | ICD-10-CM | POA: Diagnosis not present

## 2017-05-20 NOTE — Patient Instructions (Addendum)
Medication Instructions:  Your physician recommends that you continue on your current medications as directed. Please refer to the Current Medication list given to you today.  Labwork: NONE  Testing/Procedures: Your physician has requested that you have an exercise tolerance test. For further information please visit [email or web address]. Please also follow instruction sheet, as given.  Follow-Up: Your physician wants you to follow-up in: 12 months with Dr. Nishan. You will receive a reminder letter in the mail two months in advance. If you don't receive a letter, please call our office to schedule the follow-up appointment.   If you need a refill on your cardiac medications before your next appointment, please call your pharmacy.    

## 2017-05-27 ENCOUNTER — Ambulatory Visit (INDEPENDENT_AMBULATORY_CARE_PROVIDER_SITE_OTHER): Payer: BLUE CROSS/BLUE SHIELD

## 2017-05-27 DIAGNOSIS — R002 Palpitations: Secondary | ICD-10-CM

## 2017-05-27 DIAGNOSIS — I493 Ventricular premature depolarization: Secondary | ICD-10-CM

## 2017-05-27 LAB — EXERCISE TOLERANCE TEST
CHL CUP MPHR: 163 {beats}/min
CHL CUP RESTING HR STRESS: 53 {beats}/min
CSEPEW: 14.6 METS
CSEPHR: 88 %
CSEPPHR: 144 {beats}/min
Exercise duration (min): 12 min
Exercise duration (sec): 41 s
RPE: 15

## 2021-04-11 ENCOUNTER — Emergency Department (HOSPITAL_BASED_OUTPATIENT_CLINIC_OR_DEPARTMENT_OTHER)
Admission: EM | Admit: 2021-04-11 | Discharge: 2021-04-11 | Disposition: A | Discharge status: Home / Self Care | Payer: BLUE CROSS/BLUE SHIELD | Attending: Emergency Medicine | Admitting: Emergency Medicine

## 2021-04-11 ENCOUNTER — Encounter (HOSPITAL_BASED_OUTPATIENT_CLINIC_OR_DEPARTMENT_OTHER): Payer: Self-pay | Admitting: Obstetrics and Gynecology

## 2021-04-11 ENCOUNTER — Other Ambulatory Visit: Payer: Self-pay

## 2021-04-11 DIAGNOSIS — R42 Dizziness and giddiness: Secondary | ICD-10-CM | POA: Insufficient documentation

## 2021-04-11 DIAGNOSIS — Z87891 Personal history of nicotine dependence: Secondary | ICD-10-CM | POA: Insufficient documentation

## 2021-04-11 DIAGNOSIS — U071 COVID-19: Secondary | ICD-10-CM | POA: Insufficient documentation

## 2021-04-11 LAB — RESP PANEL BY RT-PCR (FLU A&B, COVID) ARPGX2
Influenza A by PCR: NEGATIVE
Influenza B by PCR: NEGATIVE
SARS Coronavirus 2 by RT PCR: POSITIVE — AB

## 2021-04-11 LAB — CBC
HCT: 43.6 % (ref 39.0–52.0)
Hemoglobin: 15 g/dL (ref 13.0–17.0)
MCH: 32.3 pg (ref 26.0–34.0)
MCHC: 34.4 g/dL (ref 30.0–36.0)
MCV: 93.8 fL (ref 80.0–100.0)
Platelets: 185 10*3/uL (ref 150–400)
RBC: 4.65 MIL/uL (ref 4.22–5.81)
RDW: 13.3 % (ref 11.5–15.5)
WBC: 4.5 10*3/uL (ref 4.0–10.5)
nRBC: 0 % (ref 0.0–0.2)

## 2021-04-11 LAB — BASIC METABOLIC PANEL
Anion gap: 10 (ref 5–15)
BUN: 17 mg/dL (ref 8–23)
CO2: 24 mmol/L (ref 22–32)
Calcium: 8.9 mg/dL (ref 8.9–10.3)
Chloride: 102 mmol/L (ref 98–111)
Creatinine, Ser: 0.75 mg/dL (ref 0.61–1.24)
GFR, Estimated: >60 mL/min (ref >60)
Glucose, Bld: 114 mg/dL — ABNORMAL HIGH (ref 70–99)
Potassium: 4.4 mmol/L (ref 3.5–5.1)
Sodium: 136 mmol/L (ref 135–145)

## 2021-04-11 LAB — URINALYSIS, ROUTINE W REFLEX MICROSCOPIC
Bilirubin Urine: NEGATIVE
Glucose, UA: NEGATIVE mg/dL
Hgb urine dipstick: NEGATIVE
Ketones, ur: 40 mg/dL — AB
Leukocytes,Ua: NEGATIVE
Nitrite: NEGATIVE
Protein, ur: 30 mg/dL — AB
Specific Gravity, Urine: 1.023 (ref 1.005–1.030)
pH: 7 (ref 5.0–8.0)

## 2021-04-11 LAB — TROPONIN I (HIGH SENSITIVITY): Troponin I (High Sensitivity): <2 ng/L (ref <18)

## 2021-04-11 MED ORDER — MECLIZINE HCL 25 MG PO TABS: 25.0000 mg | ORAL_TABLET | Freq: Three times a day (TID) | ORAL | 0 refills | Status: AC | PRN

## 2021-04-11 MED ORDER — ONDANSETRON HCL 4 MG PO TABS: 4.0000 mg | ORAL_TABLET | Freq: Three times a day (TID) | ORAL | 0 refills | Status: AC | PRN

## 2021-04-11 NOTE — ED Triage Notes (Signed)
Patient reports on Friday he started feeling sick with fever and body aches. Patient reports he started to feel better the last few days and today he woke up feeling dizzy and nauseated. Patient reports he feels like he may be having a harder time breathing.

## 2021-04-11 NOTE — ED Notes (Signed)
Pt dc home via pov. Pt states understanding of dc instructions.

## 2021-04-11 NOTE — Discharge Instructions (Addendum)
You were seen in the emergency department for evaluation of dizziness nausea and vomiting.  You tested positive for COVID.  The rest of your blood work was unremarkable.  We are prescribing you some meclizine to help with the dizziness and ondansetron for your nausea.  Please stay well-hydrated.  He should isolate for at least 5 days from the beginning of your symptoms.  Follow-up with your doctor.  Return to the emergency department if any worsening or concerning symptoms

## 2021-04-11 NOTE — ED Provider Notes (Signed)
[facility] DEPT Provider Note   CSN: [id number] Arrival date & time: 04/11/21  1306     History Chief Complaint  Patient presents with   Dizziness   Nausea    Jonathan [name] is a 60 y.o. male.  He said he got sick about a week ago with body aches and low-grade fever.  He did a home COVID test that was negative.  He been exposed to his daughter who was positive for COVID.  He felt better until today when he has been feeling dizzy with some room spinning.  He was nauseous and vomited once.  He said he has had this dizziness before.  No headache blurry vision double vision numbness or weakness.  The room spinning seems to be precipitated by head movement.  No chest pain shortness of breath  The history is provided by the patient.  Dizziness Quality:  Room spinning Severity:  Moderate Onset quality:  Gradual Duration:  6 hours Timing:  Intermittent Progression:  Unchanged Chronicity:  Recurrent Context: head movement   Relieved by:  Being still Worsened by:  Turning head Ineffective treatments:  None tried Associated symptoms: nausea and vomiting   Associated symptoms: no chest pain, no headaches, no shortness of breath and no vision changes       Past Medical History:  Diagnosis Date   Hyperlipidemia     Patient Active Problem List   Diagnosis Date Noted   Deviated nasal septum 12/18/2015   Hypertrophy of inferior nasal turbinate 12/18/2015   Transient alteration of awareness 10/26/2015   Pulsatile tinnitus 10/26/2015    Past Surgical History:  Procedure Laterality Date   ANKLE FRACTURE SURGERY Left august 2015   plates and screws    APPENDECTOMY     KNEE ARTHROSCOPY         Family History  Problem Relation Age of Onset   Cancer Mother        Breast   Breast cancer Mother    Diabetes Father    Other Maternal Grandfather        Brain Tumor   Colon cancer Neg Hx     Social History   Tobacco Use   Smoking status: Former    Smokeless tobacco: Never  Vaping Use   Vaping Use: Never used  Substance Use Topics   Alcohol use: Yes    Alcohol/week: 0.0 standard drinks    Comment: occasional   Drug use: No    Home Medications Prior to Admission medications   Medication Sig Start Date End Date Taking? Authorizing Provider  Ascorbic Acid (VITAMIN C PO) Take 1 tablet by mouth daily.    [provider]  b complex vitamins capsule Take 1 capsule by mouth daily.    [provider]  COD LIVER OIL PO Take 1 capsule by mouth daily.    [provider]  Multiple Minerals (CALCIUM/MAGNESIUM/ZINC PO) Take 1 tablet by mouth daily.    [provider]  Saw Palmetto, Serenoa repens, (SAW PALMETTO PO) Take 1 tablet by mouth daily.    [provider]    Allergies    Penicillins  Review of Systems   Review of Systems  Constitutional:  Negative for fever.  HENT:  Negative for sore throat.   Eyes:  Negative for visual disturbance.  Respiratory:  Negative for shortness of breath.   Cardiovascular:  Negative for chest pain.  Gastrointestinal:  Positive for nausea and vomiting. Negative for abdominal pain.  Genitourinary:  Negative for  dysuria.  Musculoskeletal:  Positive for myalgias.  Skin:  Negative for rash.  Neurological:  Positive for dizziness. Negative for headaches.   Physical Exam Updated Vital Signs BP 119/74   Pulse (!) 55   Temp 97.6 F (36.4 C) (Oral)   Resp 14   Ht '5\' 8"'$  (1.727 m)   Wt 65.3 kg   SpO2 98%   BMI 21.90 kg/m   Physical Exam Vitals and nursing note reviewed.  Constitutional:      Appearance: Normal appearance. He is well-developed.  HENT:     Head: Normocephalic and atraumatic.     Right Ear: Tympanic membrane normal.     Left Ear: Tympanic membrane normal.  Eyes:     Extraocular Movements: Extraocular movements intact.     Conjunctiva/sclera: Conjunctivae normal.     Pupils: Pupils are equal, round, and reactive to light.     Comments:  He has a few beats of horizontal nystagmus  Cardiovascular:     Rate and Rhythm: Normal rate and regular rhythm.     Pulses: Normal pulses.     Heart sounds: No murmur heard. Pulmonary:     Effort: Pulmonary effort is normal. No respiratory distress.     Breath sounds: Normal breath sounds.  Abdominal:     Palpations: Abdomen is soft.     Tenderness: There is no abdominal tenderness.  Musculoskeletal:        General: Normal range of motion.     Cervical back: Neck supple.     Right lower leg: No edema.     Left lower leg: No edema.  Skin:    General: Skin is warm and dry.  Neurological:     General: No focal deficit present.     Mental Status: He is alert and oriented to person, place, and time.     Cranial Nerves: No cranial nerve deficit.     Sensory: No sensory deficit.     Motor: No weakness.    ED Results / Procedures / Treatments   Labs (all labs ordered are listed, but only abnormal results are displayed) Labs Reviewed  RESP PANEL BY RT-PCR (FLU A&B, COVID) ARPGX2 - Abnormal; Notable for the following components:      Result Value   SARS Coronavirus 2 by RT PCR POSITIVE (*)    All other components within normal limits  BASIC METABOLIC PANEL - Abnormal; Notable for the following components:   Glucose, Bld 114 (*)    All other components within normal limits  URINALYSIS, ROUTINE W REFLEX MICROSCOPIC - Abnormal; Notable for the following components:   Ketones, ur 40 (*)    Protein, ur 30 (*)    All other components within normal limits  CBC  TROPONIN I (HIGH SENSITIVITY)  TROPONIN I (HIGH SENSITIVITY)    EKG EKG Interpretation  Date/Time:  Thursday April 11 2021 13:19:13 EDT Ventricular Rate:  55 PR Interval:  169 QRS Duration: 92 QT Interval:  435 QTC Calculation: 416 R Axis:   79 Text Interpretation: Sinus rhythm Consider left atrial enlargement Probable anteroseptal infarct, old No significant change since prior 12/16 Confirmed by Aletta Edouard  [phone]) on 04/11/2021 1:28:35 PM  Radiology No results found.  Procedures Procedures   Medications Ordered in ED Medications - No data to display  ED Course  I have reviewed the triage vital signs and the nursing notes.  Pertinent labs & imaging results that were available during my care of the patient were reviewed by me and  considered in my medical decision making (see chart for details).  Clinical Course as of 04/11/21 1733  Thu Apr 11, 2021  1436 Reviewed results of work-up with patient.  I feel he is out of the window for paxlovid.  Will cover with some Zofran for his nausea.  Meclizine for his dizziness.  Recommended follow-up with primary care doctor.  Return instructions discussed [MB]    Clinical Course User Index [MB] Hayden Rasmussen, MD   MDM Rules/Calculators/A&P                          This patient complains of dizziness vertigo nausea vomiting in the setting of a recent illness with myalgias and low-grade fever; this involves an extensive number of treatment Options and is a complaint that carries with it a high risk of complications and Morbidity. The differential includes vertigo, dehydration, ACS, COVID, flu Jerril Reaney was evaluated in Emergency Department on 04/11/2021 for the symptoms described in the history of present illness. He was evaluated in the context of the global COVID-19 pandemic, which necessitated consideration that the patient might be at risk for infection with the SARS-CoV-2 virus that causes COVID-19. Institutional protocols and algorithms that pertain to the evaluation of patients at risk for COVID-19 are in a state of rapid change based on information released by regulatory bodies including the CDC and federal and state organizations. These policies and algorithms were followed during the patient's care in the ED.   I ordered, reviewed and interpreted labs, which included CBC with normal white count normal hemoglobin, chemistries fairly  normal, troponin unremarkable, urinalysis with some ketones, COVID testing positive  Previous records obtained and reviewed in epic no recent admissions  After the interventions stated above, I reevaluated the patient and found patient to be hemodynamically stable and minimally symptomatic.  Reviewed results of work-up with him.  Encouraged to drink plenty of fluids.  We will provide symptomatic relief with some meclizine and nausea medication.  Return instructions discussed   Final Clinical Impression(s) / ED Diagnoses Final diagnoses:  Dizziness  COVID-19 virus infection    Rx / DC Orders ED Discharge Orders          Ordered    meclizine (ANTIVERT) 25 MG tablet  3 times daily PRN        04/11/21 1439    ondansetron (ZOFRAN) 4 MG tablet  Every 8 hours PRN        04/11/21 1439             Hayden Rasmussen, MD 04/11/21 1736

## 2022-02-15 ENCOUNTER — Encounter: Payer: Self-pay | Admitting: Gastroenterology
